# Patient Record
Sex: Male | Born: 1990 | Race: White | Hispanic: No | Marital: Single | Smoking: Current every day smoker
Health system: Southern US, Community
[De-identification: ages and names within clinical notes are randomized; demographics above are authoritative.]

## PROBLEM LIST (undated history)

## (undated) ENCOUNTER — Emergency Department: Discharge status: Absent without leave | Payer: No Typology Code available for payment source

## (undated) DIAGNOSIS — R454 Irritability and anger: Secondary | ICD-10-CM

## (undated) HISTORY — PX: TONSILLECTOMY: SUR1361

---
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
## 2010-12-21 ENCOUNTER — Emergency Department: Payer: Self-pay | Admitting: Unknown Physician Specialty

## 2011-01-27 ENCOUNTER — Emergency Department: Payer: Self-pay | Admitting: Emergency Medicine

## 2012-12-11 ENCOUNTER — Emergency Department: Payer: Self-pay | Admitting: Emergency Medicine

## 2014-03-04 ENCOUNTER — Emergency Department: Payer: Self-pay | Admitting: Emergency Medicine

## 2015-11-29 ENCOUNTER — Emergency Department: Payer: No Typology Code available for payment source

## 2015-11-29 ENCOUNTER — Emergency Department
Admission: EM | Admit: 2015-11-29 | Discharge: 2015-11-29 | Disposition: A | Discharge status: Home / Self Care | Payer: No Typology Code available for payment source | Attending: Emergency Medicine | Admitting: Emergency Medicine

## 2015-11-29 ENCOUNTER — Encounter: Payer: Self-pay | Admitting: Emergency Medicine

## 2015-11-29 DIAGNOSIS — S39012A Strain of muscle, fascia and tendon of lower back, initial encounter: Secondary | ICD-10-CM

## 2015-11-29 DIAGNOSIS — S29012A Strain of muscle and tendon of back wall of thorax, initial encounter: Secondary | ICD-10-CM | POA: Insufficient documentation

## 2015-11-29 DIAGNOSIS — F17200 Nicotine dependence, unspecified, uncomplicated: Secondary | ICD-10-CM | POA: Insufficient documentation

## 2015-11-29 DIAGNOSIS — S161XXA Strain of muscle, fascia and tendon at neck level, initial encounter: Secondary | ICD-10-CM | POA: Insufficient documentation

## 2015-11-29 DIAGNOSIS — Y9389 Activity, other specified: Secondary | ICD-10-CM | POA: Insufficient documentation

## 2015-11-29 DIAGNOSIS — H66003 Acute suppurative otitis media without spontaneous rupture of ear drum, bilateral: Secondary | ICD-10-CM | POA: Insufficient documentation

## 2015-11-29 DIAGNOSIS — S138XXA Sprain of joints and ligaments of other parts of neck, initial encounter: Secondary | ICD-10-CM | POA: Insufficient documentation

## 2015-11-29 DIAGNOSIS — Y92410 Unspecified street and highway as the place of occurrence of the external cause: Secondary | ICD-10-CM | POA: Insufficient documentation

## 2015-11-29 DIAGNOSIS — S63501A Unspecified sprain of right wrist, initial encounter: Secondary | ICD-10-CM

## 2015-11-29 DIAGNOSIS — S139XXA Sprain of joints and ligaments of unspecified parts of neck, initial encounter: Secondary | ICD-10-CM

## 2015-11-29 DIAGNOSIS — Y998 Other external cause status: Secondary | ICD-10-CM | POA: Insufficient documentation

## 2015-11-29 MED ORDER — AMOXICILLIN 500 MG PO TABS: 500.0000 mg | ORAL_TABLET | Freq: Three times a day (TID) | ORAL | Status: DC

## 2015-11-29 MED ORDER — OXYCODONE-ACETAMINOPHEN 5-325 MG PO TABS
1.0000 | ORAL_TABLET | Freq: Once | ORAL | Status: AC
  Administered 2015-11-29: 1 via ORAL
  Filled 2015-11-29: qty 1

## 2015-11-29 MED ORDER — OXYCODONE-ACETAMINOPHEN 5-325 MG PO TABS: 1.0000 | ORAL_TABLET | Freq: Four times a day (QID) | ORAL | Status: DC | PRN

## 2015-11-29 MED ORDER — IBUPROFEN 800 MG PO TABS: 800.0000 mg | ORAL_TABLET | Freq: Three times a day (TID) | ORAL | Status: DC | PRN

## 2015-11-29 MED ORDER — IBUPROFEN 800 MG PO TABS
800.0000 mg | ORAL_TABLET | Freq: Once | ORAL | Status: AC
  Administered 2015-11-29: 800 mg via ORAL
  Filled 2015-11-29: qty 1

## 2015-11-29 MED ORDER — CYCLOBENZAPRINE HCL 10 MG PO TABS: 10.0000 mg | ORAL_TABLET | Freq: Three times a day (TID) | ORAL | Status: DC | PRN
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
## 2015-11-29 NOTE — Discharge Instructions (Signed)
Low Back Strain With Rehab A strain is an injury in which a tendon or muscle is torn. The muscles and tendons of the lower back are vulnerable to strains. However, these muscles and tendons are very strong and require a great force to be injured. Strains are classified into three categories. Grade 1 strains cause pain, but the tendon is not lengthened. Grade 2 strains include a lengthened ligament, due to the ligament being stretched or partially ruptured. With grade 2 strains there is still function, although the function may be decreased. Grade 3 strains involve a complete tear of the tendon or muscle, and function is usually impaired. SYMPTOMS   Pain in the lower back.  Pain that affects one side more than the other.  Pain that gets worse with movement and may be felt in the hip, buttocks, or back of the thigh.  Muscle spasms of the muscles in the back.  Swelling along the muscles of the back.  Loss of strength of the back muscles.  Crackling sound (crepitation) when the muscles are touched. CAUSES  Lower back strains occur when a force is placed on the muscles or tendons that is greater than they can handle. Common causes of injury include:  Prolonged overuse of the muscle-tendon units in the lower back, usually from incorrect posture.  A single violent injury or force applied to the back. RISK INCREASES WITH:  Sports that involve twisting forces on the spine or a lot of bending at the waist (football, rugby, weightlifting, bowling, golf, tennis, speed skating, racquetball, swimming, running, gymnastics, diving).  Poor strength and flexibility.  Failure to warm up properly before activity.  Family history of lower back pain or disk disorders.  Previous back injury or surgery (especially fusion).  Poor posture with lifting, especially heavy objects.  Prolonged sitting, especially with poor posture. PREVENTION   Learn and use proper posture when sitting or lifting (maintain  proper posture when sitting, lift using the knees and legs, not at the waist).  Warm up and stretch properly before activity.  Allow for adequate recovery between workouts.  Maintain physical fitness:  Strength, flexibility, and endurance.  Cardiovascular fitness. PROGNOSIS  If treated properly, lower back strains usually heal within 6 weeks. RELATED COMPLICATIONS   Recurring symptoms, resulting in a chronic problem.  Chronic inflammation, scarring, and partial muscle-tendon tear.  Delayed healing or resolution of symptoms.  Prolonged disability. TREATMENT  Treatment first involves the use of ice and medicine, to reduce pain and inflammation. The use of strengthening and stretching exercises may help reduce pain with activity. These exercises may be performed at home or with a therapist. Severe injuries may require referral to a therapist for further evaluation and treatment, such as ultrasound. Your caregiver may advise that you wear a back brace or corset, to help reduce pain and discomfort. Often, prolonged bed rest results in greater harm then benefit. Corticosteroid injections may be recommended. However, these should be reserved for the most serious cases. It is important to avoid using your back when lifting objects. At night, sleep on your back on a firm mattress with a pillow placed under your knees. If non-surgical treatment is unsuccessful, surgery may be needed.  MEDICATION   If pain medicine is needed, nonsteroidal anti-inflammatory medicines (aspirin and ibuprofen), or other minor pain relievers (acetaminophen), are often advised.  Do not take pain medicine for 7 days before surgery.  Prescription pain relievers may be given, if your caregiver thinks they are needed. Use only as  directed and only as much as you need.  Ointments applied to the skin may be helpful.  Corticosteroid injections may be given by your caregiver. These injections should be reserved for the most  serious cases, because they may only be given a certain number of times. HEAT AND COLD  Cold treatment (icing) should be applied for 10 to 15 minutes every 2 to 3 hours for inflammation and pain, and immediately after activity that aggravates your symptoms. Use ice packs or an ice massage.  Heat treatment may be used before performing stretching and strengthening activities prescribed by your caregiver, physical therapist, or athletic trainer. Use a heat pack or a warm water soak. SEEK MEDICAL CARE IF:   Symptoms get worse or do not improve in 2 to 4 weeks, despite treatment.  You develop numbness, weakness, or loss of bowel or bladder function.  New, unexplained symptoms develop. (Drugs used in treatment may produce side effects.) EXERCISES  RANGE OF MOTION (ROM) AND STRETCHING EXERCISES - Low Back Strain Most people with lower back pain will find that their symptoms get worse with excessive bending forward (flexion) or arching at the lower back (extension). The exercises which will help resolve your symptoms will focus on the opposite motion.  Your physician, physical therapist or athletic trainer will help you determine which exercises will be most helpful to resolve your lower back pain. Do not complete any exercises without first consulting with your caregiver. Discontinue any exercises which make your symptoms worse until you speak to your caregiver.  If you have pain, numbness or tingling which travels down into your buttocks, leg or foot, the goal of the therapy is for these symptoms to move closer to your back and eventually resolve. Sometimes, these leg symptoms will get better, but your lower back pain may worsen. This is typically an indication of progress in your rehabilitation. Be very alert to any changes in your symptoms and the activities in which you participated in the 24 hours prior to the change. Sharing this information with your caregiver will allow him/her to most efficiently  treat your condition.  These exercises may help you when beginning to rehabilitate your injury. Your symptoms may resolve with or without further involvement from your physician, physical therapist or athletic trainer. While completing these exercises, remember:  Restoring tissue flexibility helps normal motion to return to the joints. This allows healthier, less painful movement and activity.  An effective stretch should be held for at least 30 seconds.  A stretch should never be painful. You should only feel a gentle lengthening or release in the stretched tissue. FLEXION RANGE OF MOTION AND STRETCHING EXERCISES: STRETCH - Flexion, Single Knee to Chest   Lie on a firm bed or floor with both legs extended in front of you.  Keeping one leg in contact with the floor, bring your opposite knee to your chest. Hold your leg in place by either grabbing behind your thigh or at your knee.  Pull until you feel a gentle stretch in your lower back. Hold __________ seconds.  Slowly release your grasp and repeat the exercise with the opposite side. Repeat __________ times. Complete this exercise __________ times per day.  STRETCH - Flexion, Double Knee to Chest   Lie on a firm bed or floor with both legs extended in front of you.  Keeping one leg in contact with the floor, bring your opposite knee to your chest.  Tense your stomach muscles to support your back and then   lift your other knee to your chest. Hold your legs in place by either grabbing behind your thighs or at your knees.  Pull both knees toward your chest until you feel a gentle stretch in your lower back. Hold __________ seconds.  Tense your stomach muscles and slowly return one leg at a time to the floor. Repeat __________ times. Complete this exercise __________ times per day.  STRETCH - Low Trunk Rotation  Lie on a firm bed or floor. Keeping your legs in front of you, bend your knees so they are both pointed toward the ceiling  and your feet are flat on the floor.  Extend your arms out to the side. This will stabilize your upper body by keeping your shoulders in contact with the floor.  Gently and slowly drop both knees together to one side until you feel a gentle stretch in your lower back. Hold for __________ seconds.  Tense your stomach muscles to support your lower back as you bring your knees back to the starting position. Repeat the exercise to the other side. Repeat __________ times. Complete this exercise __________ times per day  EXTENSION RANGE OF MOTION AND FLEXIBILITY EXERCISES: STRETCH - Extension, Prone on Elbows   Lie on your stomach on the floor, a bed will be too soft. Place your palms about shoulder width apart and at the height of your head.  Place your elbows under your shoulders. If this is too painful, stack pillows under your chest.  Allow your body to relax so that your hips drop lower and make contact more completely with the floor.  Hold this position for __________ seconds.  Slowly return to lying flat on the floor. Repeat __________ times. Complete this exercise __________ times per day.  RANGE OF MOTION - Extension, Prone Press Ups  Lie on your stomach on the floor, a bed will be too soft. Place your palms about shoulder width apart and at the height of your head.  Keeping your back as relaxed as possible, slowly straighten your elbows while keeping your hips on the floor. You may adjust the placement of your hands to maximize your comfort. As you gain motion, your hands will come more underneath your shoulders.  Hold this position __________ seconds.  Slowly return to lying flat on the floor. Repeat __________ times. Complete this exercise __________ times per day.  RANGE OF MOTION- Quadruped, Neutral Spine   Assume a hands and knees position on a firm surface. Keep your hands under your shoulders and your knees under your hips. You may place padding under your knees for  comfort.  Drop your head and point your tail bone toward the ground below you. This will round out your lower back like an angry cat. Hold this position for __________ seconds.  Slowly lift your head and release your tail bone so that your back sags into a large arch, like an old horse.  Hold this position for __________ seconds.  Repeat this until you feel limber in your lower back.  Now, find your "sweet spot." This will be the most comfortable position somewhere between the two previous positions. This is your neutral spine. Once you have found this position, tense your stomach muscles to support your lower back.  Hold this position for __________ seconds. Repeat __________ times. Complete this exercise __________ times per day.  STRENGTHENING EXERCISES - Low Back Strain These exercises may help you when beginning to rehabilitate your injury. These exercises should be done near your "sweet   spot." This is the neutral, low-back arch, somewhere between fully rounded and fully arched, that is your least painful position. When performed in this safe range of motion, these exercises can be used for people who have either a flexion or extension based injury. These exercises may resolve your symptoms with or without further involvement from your physician, physical therapist or athletic trainer. While completing these exercises, remember:  °· Muscles can gain both the endurance and the strength needed for everyday activities through controlled exercises. °· Complete these exercises as instructed by your physician, physical therapist or athletic trainer. Increase the resistance and repetitions only as guided. °· You may experience muscle soreness or fatigue, but the pain or discomfort you are trying to eliminate should never worsen during these exercises. If this pain does worsen, stop and make certain you are following the directions exactly. If the pain is still present after adjustments, discontinue the  exercise until you can discuss the trouble with your caregiver. °STRENGTHENING - Deep Abdominals, Pelvic Tilt °· Lie on a firm bed or floor. Keeping your legs in front of you, bend your knees so they are both pointed toward the ceiling and your feet are flat on the floor. °· Tense your lower abdominal muscles to press your lower back into the floor. This motion will rotate your pelvis so that your tail bone is scooping upwards rather than pointing at your feet or into the floor. °· With a gentle tension and even breathing, hold this position for __________ seconds. °Repeat __________ times. Complete this exercise __________ times per day.  °STRENGTHENING - Abdominals, Crunches  °· Lie on a firm bed or floor. Keeping your legs in front of you, bend your knees so they are both pointed toward the ceiling and your feet are flat on the floor. Cross your arms over your chest. °· Slightly tip your chin down without bending your neck. °· Tense your abdominals and slowly lift your trunk high enough to just clear your shoulder blades. Lifting higher can put excessive stress on the lower back and does not further strengthen your abdominal muscles. °· Control your return to the starting position. °Repeat __________ times. Complete this exercise __________ times per day.  °STRENGTHENING - Quadruped, Opposite UE/LE Lift  °· Assume a hands and knees position on a firm surface. Keep your hands under your shoulders and your knees under your hips. You may place padding under your knees for comfort. °· Find your neutral spine and gently tense your abdominal muscles so that you can maintain this position. Your shoulders and hips should form a rectangle that is parallel with the floor and is not twisted. °· Keeping your trunk steady, lift your right hand no higher than your shoulder and then your left leg no higher than your hip. Make sure you are not holding your breath. Hold this position __________ seconds. °· Continuing to keep your  abdominal muscles tense and your back steady, slowly return to your starting position. Repeat with the opposite arm and leg. °Repeat __________ times. Complete this exercise __________ times per day.  °STRENGTHENING - Lower Abdominals, Double Knee Lift °· Lie on a firm bed or floor. Keeping your legs in front of you, bend your knees so they are both pointed toward the ceiling and your feet are flat on the floor. °· Tense your abdominal muscles to brace your lower back and slowly lift both of your knees until they come over your hips. Be certain not to hold your breath. °·   Hold __________ seconds. Using your abdominal muscles, return to the starting position in a slow and controlled manner. Repeat __________ times. Complete this exercise __________ times per day.  POSTURE AND BODY MECHANICS CONSIDERATIONS - Low Back Strain Keeping correct posture when sitting, standing or completing your activities will reduce the stress put on different body tissues, allowing injured tissues a chance to heal and limiting painful experiences. The following are general guidelines for improved posture. Your physician or physical therapist will provide you with any instructions specific to your needs. While reading these guidelines, remember:  The exercises prescribed by your provider will help you have the flexibility and strength to maintain correct postures.  The correct posture provides the best environment for your joints to work. All of your joints have less wear and tear when properly supported by a spine with good posture. This means you will experience a healthier, less painful body.  Correct posture must be practiced with all of your activities, especially prolonged sitting and standing. Correct posture is as important when doing repetitive low-stress activities (typing) as it is when doing a single heavy-load activity (lifting). RESTING POSITIONS Consider which positions are most painful for you when choosing a  resting position. If you have pain with flexion-based activities (sitting, bending, stooping, squatting), choose a position that allows you to rest in a less flexed posture. You would want to avoid curling into a fetal position on your side. If your pain worsens with extension-based activities (prolonged standing, working overhead), avoid resting in an extended position such as sleeping on your stomach. Most people will find more comfort when they rest with their spine in a more neutral position, neither too rounded nor too arched. Lying on a non-sagging bed on your side with a pillow between your knees, or on your back with a pillow under your knees will often provide some relief. Keep in mind, being in any one position for a prolonged period of time, no matter how correct your posture, can still lead to stiffness. PROPER SITTING POSTURE In order to minimize stress and discomfort on your spine, you must sit with correct posture. Sitting with good posture should be effortless for a healthy body. Returning to good posture is a gradual process. Many people can work toward this most comfortably by using various supports until they have the flexibility and strength to maintain this posture on their own. When sitting with proper posture, your ears will fall over your shoulders and your shoulders will fall over your hips. You should use the back of the chair to support your upper back. Your lower back will be in a neutral position, just slightly arched. You may place a small pillow or folded towel at the base of your lower back for support.  When working at a desk, create an environment that supports good, upright posture. Without extra support, muscles tire, which leads to excessive strain on joints and other tissues. Keep these recommendations in mind: CHAIR:  A chair should be able to slide under your desk when your back makes contact with the back of the chair. This allows you to work closely.  The chair's  height should allow your eyes to be level with the upper part of your monitor and your hands to be slightly lower than your elbows. BODY POSITION  Your feet should make contact with the floor. If this is not possible, use a foot rest.  Keep your ears over your shoulders. This will reduce stress on your neck and  lower back. INCORRECT SITTING POSTURES  If you are feeling tired and unable to assume a healthy sitting posture, do not slouch or slump. This puts excessive strain on your back tissues, causing more damage and pain. Healthier options include:  Using more support, like a lumbar pillow.  Switching tasks to something that requires you to be upright or walking.  Talking a brief walk.  Lying down to rest in a neutral-spine position. PROLONGED STANDING WHILE SLIGHTLY LEANING FORWARD  When completing a task that requires you to lean forward while standing in one place for a long time, place either foot up on a stationary 2-4 inch high object to help maintain the best posture. When both feet are on the ground, the lower back tends to lose its slight inward curve. If this curve flattens (or becomes too large), then the back and your other joints will experience too much stress, tire more quickly, and can cause pain. CORRECT STANDING POSTURES Proper standing posture should be assumed with all daily activities, even if they only take a few moments, like when brushing your teeth. As in sitting, your ears should fall over your shoulders and your shoulders should fall over your hips. You should keep a slight tension in your abdominal muscles to brace your spine. Your tailbone should point down to the ground, not behind your body, resulting in an over-extended swayback posture.  INCORRECT STANDING POSTURES  Common incorrect standing postures include a forward head, locked knees and/or an excessive swayback. WALKING Walk with an upright posture. Your ears, shoulders and hips should all  line-up. PROLONGED ACTIVITY IN A FLEXED POSITION When completing a task that requires you to bend forward at your waist or lean over a low surface, try to find a way to stabilize 3 out of 4 of your limbs. You can place a hand or elbow on your thigh or rest a knee on the surface you are reaching across. This will provide you more stability so that your muscles do not fatigue as quickly. By keeping your knees relaxed, or slightly bent, you will also reduce stress across your lower back. CORRECT LIFTING TECHNIQUES DO :   Assume a wide stance. This will provide you more stability and the opportunity to get as close as possible to the object which you are lifting.  Tense your abdominals to brace your spine. Bend at the knees and hips. Keeping your back locked in a neutral-spine position, lift using your leg muscles. Lift with your legs, keeping your back straight.  Test the weight of unknown objects before attempting to lift them.  Try to keep your elbows locked down at your sides in order get the best strength from your shoulders when carrying an object.  Always ask for help when lifting heavy or awkward objects. INCORRECT LIFTING TECHNIQUES DO NOT:   Lock your knees when lifting, even if it is a small object.  Bend and twist. Pivot at your feet or move your feet when needing to change directions.  Assume that you can safely pick up even a paper clip without proper posture.   This information is not intended to replace advice given to you by your health care provider. Make sure you discuss any questions you have with your health care provider.   Document Released: 09/15/2005 Document Revised: 10/06/2014 Document Reviewed: 12/28/2008 [employer] Interactive Patient Education 2016 [employer]  Generic Wrist Exercises RANGE OF MOTION (ROM) AND STRETCHING EXERCISES - Wrist Sprain  These exercises may help you when  beginning to rehabilitate your injury. Your symptoms may resolve with or without  further involvement from your physician, physical therapist, or athletic trainer. While completing these exercises, remember:   Restoring tissue flexibility helps normal motion to return to the joints. This allows healthier, less painful movement and activity.  An effective stretch should be held for at least 30 seconds.  A stretch should never be painful. You should only feel a gentle lengthening or release in the stretched tissue. RANGE OF MOTION - Wrist Flexion, Active-Assisted  Extend your right / left elbow with your palm pointing down.*  Gently pull the back of your hand toward you until you feel a gentle stretch on the top of your forearm.  Hold this position for __________ seconds. Repeat __________ times. Complete this exercise __________ times per day.  *If directed by your physician, physical therapist, or athletic trainer, complete this stretch with your elbow bent rather than extended. RANGE OF MOTION - Wrist Extension, Active-Assisted   Extend your right / left elbow and turn your palm upward.*  Gently pull your palm/fingertips back so your wrist extends and your fingers point more toward the ground.  You should feel a gentle stretch on the inside of your forearm.  Hold this position for __________ seconds. Repeat __________ times. Complete this exercise __________ times per day. *If directed by your physician, physical therapist, or athletic trainer, complete this stretch with your elbow bent, rather than extended. RANGE OF MOTION - Supination, Active   Stand or sit with your elbows at your side. Bend your right / left elbow to 90 degrees.  Turn your palm upward until you feel a gentle stretch on the inside of your forearm.  Hold this position for __________ seconds. Slowly release and return to the starting position. Repeat __________ times. Complete this stretch __________ times per day.  RANGE OF MOTION - Pronation, Active   Stand or sit with your elbows at your  side. Bend your right / left elbow to 90 degrees.  Turn your palm downward until you feel a gentle stretch on the top of your forearm.  Hold this position for __________ seconds. Slowly release and return to the starting position. Repeat __________ times. Complete this stretch __________ times per day.  STRENGTHENING EXERCISES  These exercises may help you when beginning to rehabilitate your injury. They may resolve your symptoms with or without further involvement from your physician, physical therapist, or athletic trainer. While completing these exercises, remember:   Muscles can gain both the endurance and the strength needed for everyday activities through controlled exercises.  Complete these exercises as instructed by your physician, physical therapist, or athletic trainer. Progress the resistance and repetitions only as guided.  You may experience muscle soreness or fatigue, but the pain or discomfort you are trying to eliminate should never worsen during these exercises. If this pain does worsen, stop and make certain you are following the directions exactly. If the pain is still present after adjustments, discontinue the exercise until you can discuss the trouble with your clinician. STRENGTH - Wrist Flexors  Sit with your right / left forearm palm-up and fully supported. Your elbow should be resting below the height of your shoulder. Allow your wrist to extend over the edge of the surface.  Loosely holding a __________ weight or a piece of rubber exercise band/tubing, slowly curl your hand up toward your forearm.  Hold this position for __________ seconds. Slowly lower the wrist back to the starting position in a  controlled manner. Repeat __________ times. Complete this exercise __________ times per day.  STRENGTH - Wrist Extensors  Sit with your right / left forearm palm-down and fully supported. Your elbow should be resting below the height of your shoulder. Allow your wrist to  extend over the edge of the surface.  Loosely holding a __________ weight or a piece of rubber exercise band/tubing, slowly curl your hand up toward your forearm.  Hold this position for __________ seconds. Slowly lower the wrist back to the starting position in a controlled manner. Repeat __________ times. Complete this exercise __________ times per day.  STRENGTH - Forearm Supinators  Sit with your right / left forearm supported on a table, keeping your elbow below shoulder height. Rest your hand over the edge, palm down.  Gently grip a hammer or a soup ladle.  Without moving your elbow, slowly turn your palm and hand upward to a "thumbs-up" position.  Hold this position for __________ seconds. Slowly return to the starting position. Repeat __________ times. Complete this exercise __________ times per day.  STRENGTH - Forearm Pronators   Sit with your right / left forearm supported on a table, keeping your elbow below shoulder height. Rest your hand over the edge, palm up.  Gently grip a hammer or a soup ladle.  Without moving your elbow, slowly turn your palm and hand upward to a "thumbs-up" position.  Hold this position for __________ seconds. Slowly return to the starting position. Repeat __________ times. Complete this exercise __________ times per day.  STRENGTH - Grip  Grasp a tennis ball, a dense sponge, or a large, rolled sock in your hand.  Squeeze as hard as you can without increasing any pain.  Hold this position for __________ seconds. Release your grip slowly. Repeat __________ times. Complete this exercise __________ times per day.    This information is not intended to replace advice given to you by your health care provider. Make sure you discuss any questions you have with your health care provider.   Document Released: 07/30/2005 Document Revised: 10/06/2014 Document Reviewed: 12/28/2008 Elsevier Interactive Patient Education 2016 [employer]  Wrist  Sprain With Rehab A sprain is an injury in which a ligament that maintains the proper alignment of a joint is partially or completely torn. The ligaments of the wrist are susceptible to sprains. Sprains are classified into three categories. Grade 1 sprains cause pain, but the tendon is not lengthened. Grade 2 sprains include a lengthened ligament because the ligament is stretched or partially ruptured. With grade 2 sprains there is still function, although the function may be diminished. Grade 3 sprains are characterized by a complete tear of the tendon or muscle, and function is usually impaired. SYMPTOMS   Pain tenderness, inflammation, and/or bruising (contusion) of the injury.  A "pop" or tear felt and/or heard at the time of injury.  Decreased wrist function. CAUSES  A wrist sprain occurs when a force is placed on one or more ligaments that is greater than it/they can withstand. Common mechanisms of injury include:  Catching a ball with your hands.  Repetitive and/ or strenuous extension or flexion of the wrist. RISK INCREASES WITH:  Previous wrist injury.  Contact sports (boxing or wrestling).  Activities in which falling is common.  Poor strength and flexibility.  Improperly fitted or padded protective equipment. PREVENTION  Warm up and stretch properly before activity.  Allow for adequate recovery between workouts.  Maintain physical fitness:  Strength, flexibility, and endurance.  Cardiovascular  fitness.  Protect the wrist joint by limiting its motion with the use of taping, braces, or splints.  Protect the wrist after injury for 6 to 12 months. PROGNOSIS  The prognosis for wrist sprains depends on the degree of injury. Grade 1 sprains require 2 to 6 weeks of treatment. Grade 2 sprains require 6 to 8 weeks of treatment, and grade 3 sprains require up to 12 weeks.  RELATED COMPLICATIONS   Prolonged healing time, if improperly treated or re-injured.  Recurrent  symptoms that result in a chronic problem.  Injury to nearby structures (bone, cartilage, nerves, or tendons).  Arthritis of the wrist.  Inability to compete in athletics at a high level.  Wrist stiffness or weakness.  Progression to a complete rupture of the ligament. TREATMENT  Treatment initially involves resting from any activities that aggravate the symptoms, and the use of ice and medications to help reduce pain and inflammation. Your caregiver may recommend immobilizing the wrist for a period of time in order to reduce stress on the ligament and allow for healing. After immobilization it is important to perform strengthening and stretching exercises to help regain strength and a full range of motion. These exercises may be completed at home or with a therapist. Surgery is not usually required for wrist sprains, unless the ligament has been ruptured (grade 3 sprain). MEDICATION   If pain medication is necessary, then nonsteroidal anti-inflammatory medications, such as aspirin and ibuprofen, or other minor pain relievers, such as acetaminophen, are often recommended.  Do not take pain medication for 7 days before surgery.  Prescription pain relievers may be given if deemed necessary by your caregiver. Use only as directed and only as much as you need. HEAT AND COLD  Cold treatment (icing) relieves pain and reduces inflammation. Cold treatment should be applied for 10 to 15 minutes every 2 to 3 hours for inflammation and pain and immediately after any activity that aggravates your symptoms. Use ice packs or massage the area with a piece of ice (ice massage).  Heat treatment may be used prior to performing the stretching and strengthening activities prescribed by your caregiver, physical therapist, or athletic trainer. Use a heat pack or soak your injury in warm water. SEEK MEDICAL CARE IF:  Treatment seems to offer no benefit, or the condition worsens.  Any medications produce  adverse side effects. EXERCISES RANGE OF MOTION (ROM) AND STRETCHING EXERCISES - Wrist Sprain  These exercises may help you when beginning to rehabilitate your injury. Your symptoms may resolve with or without further involvement from your physician, physical therapist or athletic trainer. While completing these exercises, remember:   Restoring tissue flexibility helps normal motion to return to the joints. This allows healthier, less painful movement and activity.  An effective stretch should be held for at least 30 seconds.  A stretch should never be painful. You should only feel a gentle lengthening or release in the stretched tissue. RANGE OF MOTION - Wrist Flexion, Active-Assisted  Extend your right / left elbow with your fingers pointing down.*  Gently pull the back of your hand towards you until you feel a gentle stretch on the top of your forearm.  Hold this position for __________ seconds. Repeat __________ times. Complete this exercise __________ times per day.  *If directed by your physician, physical therapist or athletic trainer, complete this stretch with your elbow bent rather than extended. RANGE OF MOTION - Wrist Extension, Active-Assisted  Extend your right / left elbow and turn your  palm upwards.*  Gently pull your palm/fingertips back so your wrist extends and your fingers point more toward the ground.  You should feel a gentle stretch on the inside of your forearm.  Hold this position for __________ seconds. Repeat __________ times. Complete this exercise __________ times per day. *If directed by your physician, physical therapist or athletic trainer, complete this stretch with your elbow bent, rather than extended. RANGE OF MOTION - Supination, Active  Stand or sit with your elbows at your side. Bend your right / left elbow to 90 degrees.  Turn your palm upward until you feel a gentle stretch on the inside of your forearm.  Hold this position for __________  seconds. Slowly release and return to the starting position. Repeat __________ times. Complete this stretch __________ times per day.  RANGE OF MOTION - Pronation, Active  Stand or sit with your elbows at your side. Bend your right / left elbow to 90 degrees.  Turn your palm downward until you feel a gentle stretch on the top of your forearm.  Hold this position for __________ seconds. Slowly release and return to the starting position. Repeat __________ times. Complete this stretch __________ times per day.  STRETCH - Wrist Flexion  Place the back of your right / left hand on a tabletop leaving your elbow slightly bent. Your fingers should point away from your body.  Gently press the back of your hand down onto the table by straightening your elbow. You should feel a stretch on the top of your forearm.  Hold this position for __________ seconds. Repeat __________ times. Complete this stretch __________ times per day.  STRETCH - Wrist Extension  Place your right / left fingertips on a tabletop leaving your elbow slightly bent. Your fingers should point backwards.  Gently press your fingers and palm down onto the table by straightening your elbow. You should feel a stretch on the inside of your forearm.  Hold this position for __________ seconds. Repeat __________ times. Complete this stretch __________ times per day.  STRENGTHENING EXERCISES - Wrist Sprain These exercises may help you when beginning to rehabilitate your injury. They may resolve your symptoms with or without further involvement from your physician, physical therapist or athletic trainer. While completing these exercises, remember:   Muscles can gain both the endurance and the strength needed for everyday activities through controlled exercises.  Complete these exercises as instructed by your physician, physical therapist or athletic trainer. Progress with the resistance and repetition exercises only as your caregiver  advises. STRENGTH - Wrist Flexors  Sit with your right / left forearm palm-up and fully supported. Your elbow should be resting below the height of your shoulder. Allow your wrist to extend over the edge of the surface.  Loosely holding a __________ weight or a piece of rubber exercise band/tubing, slowly curl your hand up toward your forearm.  Hold this position for __________ seconds. Slowly lower the wrist back to the starting position in a controlled manner. Repeat __________ times. Complete this exercise __________ times per day.  STRENGTH - Wrist Extensors  Sit with your right / left forearm palm-down and fully supported. Your elbow should be resting below the height of your shoulder. Allow your wrist to extend over the edge of the surface.  Loosely holding a __________ weight or a piece of rubber exercise band/tubing, slowly curl your hand up toward your forearm.  Hold this position for __________ seconds. Slowly lower the wrist back to the starting position in  a controlled manner. Repeat __________ times. Complete this exercise __________ times per day.  STRENGTH - Ulnar Deviators  Stand with a ____________________ weight in your right / left hand, or sit holding on to the rubber exercise band/tubing with your opposite arm supported.  Move your wrist so that your pinkie travels toward your forearm and your thumb moves away from your forearm.  Hold this position for __________ seconds and then slowly lower the wrist back to the starting position. Repeat __________ times. Complete this exercise __________ times per day STRENGTH - Radial Deviators  Stand with a ____________________ weight in your  right / left hand, or sit holding on to the rubber exercise band/tubing with your arm supported.  Raise your hand upward in front of you or pull up on the rubber tubing.  Hold this position for __________ seconds and then slowly lower the wrist back to the starting position. Repeat  __________ times. Complete this exercise __________ times per day. STRENGTH - Forearm Supinators  Sit with your right / left forearm supported on a table, keeping your elbow below shoulder height. Rest your hand over the edge, palm down.  Gently grip a hammer or a soup ladle.  Without moving your elbow, slowly turn your palm and hand upward to a "thumbs-up" position.  Hold this position for __________ seconds. Slowly return to the starting position. Repeat __________ times. Complete this exercise __________ times per day.  STRENGTH - Forearm Pronators  Sit with your right / left forearm supported on a table, keeping your elbow below shoulder height. Rest your hand over the edge, palm up.  Gently grip a hammer or a soup ladle.  Without moving your elbow, slowly turn your palm and hand upward to a "thumbs-up" position.  Hold this position for __________ seconds. Slowly return to the starting position. Repeat __________ times. Complete this exercise __________ times per day.  STRENGTH - Grip  Grasp a tennis ball, a dense sponge, or a large, rolled sock in your hand.  Squeeze as hard as you can without increasing any pain.  Hold this position for __________ seconds. Release your grip slowly. Repeat __________ times. Complete this exercise __________ times per day.    This information is not intended to replace advice given to you by your health care provider. Make sure you discuss any questions you have with your health care provider.   Document Released: 09/15/2005 Document Revised: 06/06/2015 Document Reviewed: 12/28/2008 Elsevier Interactive Patient Education 2016 [employer]   Take pain medicine as directed. Follow-up with the orthopedics if not improving. Return to the emergency room for any concerns.
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
## 2016-01-31 LAB — HM HIV SCREENING LAB: HM HIV Screening: NEGATIVE

## 2016-12-28 ENCOUNTER — Encounter: Payer: Self-pay | Admitting: Emergency Medicine

## 2016-12-28 ENCOUNTER — Emergency Department
Admission: EM | Admit: 2016-12-28 | Discharge: 2016-12-28 | Disposition: A | Discharge status: Home / Self Care | Payer: Self-pay | Attending: Emergency Medicine | Admitting: Emergency Medicine

## 2016-12-28 DIAGNOSIS — F10120 Alcohol abuse with intoxication, uncomplicated: Secondary | ICD-10-CM | POA: Insufficient documentation

## 2016-12-28 DIAGNOSIS — Y999 Unspecified external cause status: Secondary | ICD-10-CM | POA: Insufficient documentation

## 2016-12-28 DIAGNOSIS — F17200 Nicotine dependence, unspecified, uncomplicated: Secondary | ICD-10-CM | POA: Insufficient documentation

## 2016-12-28 DIAGNOSIS — S50812A Abrasion of left forearm, initial encounter: Secondary | ICD-10-CM | POA: Insufficient documentation

## 2016-12-28 DIAGNOSIS — Y939 Activity, unspecified: Secondary | ICD-10-CM | POA: Insufficient documentation

## 2016-12-28 DIAGNOSIS — X789XXA Intentional self-harm by unspecified sharp object, initial encounter: Secondary | ICD-10-CM | POA: Insufficient documentation

## 2016-12-28 DIAGNOSIS — Y929 Unspecified place or not applicable: Secondary | ICD-10-CM | POA: Insufficient documentation

## 2016-12-28 DIAGNOSIS — T148XXA Other injury of unspecified body region, initial encounter: Secondary | ICD-10-CM

## 2016-12-28 DIAGNOSIS — F10920 Alcohol use, unspecified with intoxication, uncomplicated: Secondary | ICD-10-CM

## 2016-12-28 LAB — COMPREHENSIVE METABOLIC PANEL
ALT: 19 U/L (ref 17–63)
ANION GAP: 9 (ref 5–15)
AST: 27 U/L (ref 15–41)
Albumin: 4.6 g/dL (ref 3.5–5.0)
Alkaline Phosphatase: 52 U/L (ref 38–126)
BUN: 13 mg/dL (ref 6–20)
CO2: 23 mmol/L (ref 22–32)
Calcium: 8.9 mg/dL (ref 8.9–10.3)
Chloride: 109 mmol/L (ref 101–111)
Creatinine, Ser: 0.96 mg/dL (ref 0.61–1.24)
Glucose, Bld: 109 mg/dL — ABNORMAL HIGH (ref 65–99)
POTASSIUM: 3.3 mmol/L — AB (ref 3.5–5.1)
Sodium: 141 mmol/L (ref 135–145)
Total Bilirubin: 0.6 mg/dL (ref 0.3–1.2)
Total Protein: 7.6 g/dL (ref 6.5–8.1)

## 2016-12-28 LAB — CBC
HEMATOCRIT: 45.6 % (ref 40.0–52.0)
HEMOGLOBIN: 15.7 g/dL (ref 13.0–18.0)
MCH: 31.1 pg (ref 26.0–34.0)
MCHC: 34.5 g/dL (ref 32.0–36.0)
MCV: 90.3 fL (ref 80.0–100.0)
Platelets: 237 10*3/uL (ref 150–440)
RBC: 5.05 MIL/uL (ref 4.40–5.90)
RDW: 14.1 % (ref 11.5–14.5)
WBC: 9.6 10*3/uL (ref 3.8–10.6)

## 2016-12-28 LAB — ETHANOL: ALCOHOL ETHYL (B): 167 mg/dL — AB (ref <5)

## 2016-12-28 NOTE — ED Notes (Signed)
Pt ambulated to side door and back to room with no problems and no complaints. SOC set up in pt room and explained to pt what would take place. Pt stated he understood.

## 2016-12-28 NOTE — Discharge Instructions (Signed)
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
## 2016-12-28 NOTE — ED Notes (Signed)
ED Provider at bedside. 

## 2016-12-28 NOTE — ED Notes (Signed)
Pt given phone to call ride 

## 2016-12-28 NOTE — ED Triage Notes (Signed)
FIRST NURSE NOTE:  Arrives with [place] [employer] to be evaluated voluntarily.  Patient states he was cutting himself this morning because he was anxious.  Denies SI/HI.  Superficial cuts noted to right posterior forearm.  Bleeding controlled.

## 2016-12-28 NOTE — ED Notes (Signed)
Pt called mother for ride.  Patient to go to lobby to wait for her.
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
## 2016-12-28 NOTE — ED Notes (Signed)
Tech, Pam and RN, Jeannett Senior performing 15 min checks on pt. Pt is sleeping at this time.

## 2017-02-27 ENCOUNTER — Emergency Department
Admission: EM | Admit: 2017-02-27 | Discharge: 2017-02-28 | Disposition: A | Discharge status: Home / Self Care | Payer: Self-pay | Attending: Emergency Medicine | Admitting: Emergency Medicine

## 2017-02-27 ENCOUNTER — Encounter: Payer: Self-pay | Admitting: Emergency Medicine

## 2017-02-27 DIAGNOSIS — L089 Local infection of the skin and subcutaneous tissue, unspecified: Secondary | ICD-10-CM

## 2017-02-27 DIAGNOSIS — Y999 Unspecified external cause status: Secondary | ICD-10-CM | POA: Insufficient documentation

## 2017-02-27 DIAGNOSIS — W57XXXA Bitten or stung by nonvenomous insect and other nonvenomous arthropods, initial encounter: Secondary | ICD-10-CM | POA: Insufficient documentation

## 2017-02-27 DIAGNOSIS — S80862A Insect bite (nonvenomous), left lower leg, initial encounter: Secondary | ICD-10-CM | POA: Insufficient documentation

## 2017-02-27 DIAGNOSIS — Y929 Unspecified place or not applicable: Secondary | ICD-10-CM | POA: Insufficient documentation

## 2017-02-27 DIAGNOSIS — F17200 Nicotine dependence, unspecified, uncomplicated: Secondary | ICD-10-CM | POA: Insufficient documentation

## 2017-02-27 DIAGNOSIS — Y939 Activity, unspecified: Secondary | ICD-10-CM | POA: Insufficient documentation

## 2017-02-27 LAB — COMPREHENSIVE METABOLIC PANEL
ALBUMIN: 4.3 g/dL (ref 3.5–5.0)
ALT: 25 U/L (ref 17–63)
AST: 30 U/L (ref 15–41)
Alkaline Phosphatase: 54 U/L (ref 38–126)
Anion gap: 10 (ref 5–15)
BUN: 19 mg/dL (ref 6–20)
CHLORIDE: 107 mmol/L (ref 101–111)
CO2: 25 mmol/L (ref 22–32)
CREATININE: 1.28 mg/dL — AB (ref 0.61–1.24)
Calcium: 9.3 mg/dL (ref 8.9–10.3)
GFR calc Af Amer: >60 mL/min (ref >60)
Glucose, Bld: 108 mg/dL — ABNORMAL HIGH (ref 65–99)
POTASSIUM: 3.5 mmol/L (ref 3.5–5.1)
Sodium: 142 mmol/L (ref 135–145)
Total Bilirubin: 0.6 mg/dL (ref 0.3–1.2)
Total Protein: 7 g/dL (ref 6.5–8.1)

## 2017-02-27 LAB — CBC WITH DIFFERENTIAL/PLATELET
Basophils Absolute: 0.1 10*3/uL (ref 0–0.1)
Basophils Relative: 1 %
EOS ABS: 0.1 10*3/uL (ref 0–0.7)
EOS PCT: 1 %
HCT: 46.9 % (ref 40.0–52.0)
Hemoglobin: 16.1 g/dL (ref 13.0–18.0)
LYMPHS ABS: 2.3 10*3/uL (ref 1.0–3.6)
LYMPHS PCT: 18 %
MCH: 30.9 pg (ref 26.0–34.0)
MCHC: 34.3 g/dL (ref 32.0–36.0)
MCV: 90.2 fL (ref 80.0–100.0)
MONO ABS: 1.1 10*3/uL — AB (ref 0.2–1.0)
MONOS PCT: 8 %
Neutro Abs: 9.5 10*3/uL — ABNORMAL HIGH (ref 1.4–6.5)
Neutrophils Relative %: 72 %
PLATELETS: 242 10*3/uL (ref 150–440)
RBC: 5.2 MIL/uL (ref 4.40–5.90)
RDW: 13.7 % (ref 11.5–14.5)
WBC: 13.1 10*3/uL — ABNORMAL HIGH (ref 3.8–10.6)

## 2017-02-27 NOTE — ED Notes (Signed)
Pt report spider bite two days ago to ,left lower leg, redness/swelling/warmth noted, area of redness measured 13cmx10cm.  Scabbed puncture seen in middle of reddened area.

## 2017-02-27 NOTE — ED Triage Notes (Signed)
Pt to triage via WC, reports spider bite to left lower leg 2 days ago, redness/swelling noted to medial aspect of left lower leg.  Reports warm to touch.  Pt reports he squeezed it and pus came out.  Pt reports unable to walk due to pain.

## 2017-02-28 MED ORDER — OXYCODONE-ACETAMINOPHEN 5-325 MG PO TABS: 1.0000 | ORAL_TABLET | Freq: Four times a day (QID) | ORAL | 0 refills | Status: DC | PRN

## 2017-02-28 MED ORDER — OXYCODONE-ACETAMINOPHEN 5-325 MG PO TABS
1.0000 | ORAL_TABLET | Freq: Once | ORAL | Status: AC
  Administered 2017-02-28: 1 via ORAL
  Filled 2017-02-28: qty 1

## 2017-02-28 MED ORDER — SULFAMETHOXAZOLE-TRIMETHOPRIM 800-160 MG PO TABS
1.0000 | ORAL_TABLET | Freq: Once | ORAL | Status: AC
  Administered 2017-02-28: 1 via ORAL
  Filled 2017-02-28: qty 1

## 2017-02-28 MED ORDER — SULFAMETHOXAZOLE-TRIMETHOPRIM 800-160 MG PO TABS: 1.0000 | ORAL_TABLET | Freq: Two times a day (BID) | ORAL | 0 refills | Status: AC

## 2017-02-28 NOTE — ED Provider Notes (Signed)
[facility] Emergency Department Provider Note    First MD Initiated Contact with Patient 02/28/17 0006     (approximate)  I have reviewed the triage vital signs and the nursing notes.   HISTORY  Chief Complaint Insect Bite and Leg Pain    HPI Spencer Schultz is a 26 y.o. male presents to the emergency department 1 day history of insect bite to the left lower extremity with surrounding redness and pus. Patient denies any fever or febrile on presentation. Patient states that he did not see what insect bit him.  Past medical history None There are no active problems to display for this patient.   Past surgical history None  Prior to Admission medications   Medication Sig Start Date End Date Taking? Authorizing Provider  amoxicillin (AMOXIL) 500 MG tablet Take 1 tablet (500 mg total) by mouth 3 (three) times daily. 11/29/15   Ignacia Bayley, PA-C  cyclobenzaprine (FLEXERIL) 10 MG tablet Take 1 tablet (10 mg total) by mouth every 8 (eight) hours as needed for muscle spasms. 11/29/15   Ignacia Bayley, PA-C  ibuprofen (ADVIL,MOTRIN) 800 MG tablet Take 1 tablet (800 mg total) by mouth every 8 (eight) hours as needed. 11/29/15   Ignacia Bayley, PA-C  oxyCODONE-acetaminophen (ROXICET) 5-325 MG tablet Take 1 tablet by mouth every 6 (six) hours as needed. 11/29/15   Ignacia Bayley, PA-C    Allergies Patient has no known allergies.  History reviewed. No pertinent family history.  Social History Social History  Substance Use Topics  . Smoking status: Current Every Day Smoker  . Smokeless tobacco: Never Used  . Alcohol use No    Review of Systems Constitutional: No fever/chills Eyes: No visual changes. ENT: No sore throat. Cardiovascular: Denies chest pain. Respiratory: Denies shortness of breath. Gastrointestinal: No abdominal pain.  No nausea, no vomiting.  No diarrhea.  No constipation. Genitourinary: Negative for dysuria. Musculoskeletal: Negative for neck  pain.  Negative for back pain. Integumentary: Negative for rash.Positive for insect bite to the left leg with redness Neurological: Negative for headaches, focal weakness or numbness.  ____________________________________________   PHYSICAL EXAM:  VITAL SIGNS: ED Triage Vitals  Enc Vitals Group     BP 02/27/17 2245 (!) 132/91     Pulse Rate 02/27/17 2245 94     Resp 02/27/17 2245 16     Temp 02/27/17 2245 98.8 F (37.1 C)     Temp Source 02/27/17 2245 Oral     SpO2 02/27/17 2245 99 %     Weight 02/27/17 2245 90.7 kg (200 lb)     Height 02/27/17 2245 1.778 m (5\' 10" )     Head Circumference --      Peak Flow --      Pain Score 02/27/17 2244 7     Pain Loc --      Pain Edu? --      Excl. in GC? --     Constitutional: Alert and oriented. Well appearing and in no acute distress. Eyes: Conjunctivae are normal. Head: Atraumatic. Mouth/Throat: Mucous membranes are moist. Oropharynx non-erythematous. Neck: No stridor.   Cardiovascular: Normal rate, regular rhythm. Good peripheral circulation. Grossly normal heart sounds. Respiratory: Normal respiratory effort.  No retractions. Lungs CTAB. Gastrointestinal: Soft and nontender. No distention.  Musculoskeletal: No lower extremity tenderness nor edema. No gross deformities of extremities. Neurologic:  Normal speech and language. No gross focal neurologic deficits are appreciated.  Skin:  1 mm punctate area of excoriation with surrounding blanching erythema  and is approximately 6 x 6 cm in the left leg. No evidence of necrosis. Psychiatric: Mood and affect are normal. Speech and behavior are normal.  ____________________________________________   LABS (all labs ordered are listed, but only abnormal results are displayed)  Labs Reviewed  COMPREHENSIVE METABOLIC PANEL - Abnormal; Notable for the following:       Result Value   Glucose, Bld 108 (*)    Creatinine, Ser 1.28 (*)    All other components within normal limits  CBC WITH  DIFFERENTIAL/PLATELET - Abnormal; Notable for the following:    WBC 13.1 (*)    Neutro Abs 9.5 (*)    Monocytes Absolute 1.1 (*)    All other components within normal limits   __________________  Procedures   ____________________________________________   INITIAL IMPRESSION / ASSESSMENT AND PLAN / ED COURSE  Pertinent labs & imaging results that were available during my care of the patient were reviewed by me and considered in my medical decision making (see chart for details).  Patient given Bactrim DS and Percocet emergency department will be prescribed same for home. Spoke with the patient at length regarding warning signs of worsening infection that would want return to the emergency department.      ____________________________________________  FINAL CLINICAL IMPRESSION(S) / ED DIAGNOSES  Final diagnoses:  Infected insect bite of left lower extremity, initial encounter     MEDICATIONS GIVEN DURING THIS VISIT:  Medications  sulfamethoxazole-trimethoprim (BACTRIM DS,SEPTRA DS) 800-160 MG per tablet 1 tablet (not administered)  oxyCODONE-acetaminophen (PERCOCET/ROXICET) 5-325 MG per tablet 1 tablet (not administered)     NEW OUTPATIENT MEDICATIONS STARTED DURING THIS VISIT:  New Prescriptions   No medications on file    Modified Medications   No medications on file    Discontinued Medications   No medications on file     Note:  This document was prepared using Dragon voice recognition software and may include unintentional dictation errors.    [name], [place], MD 02/28/17 [phone]

## 2017-03-01 ENCOUNTER — Emergency Department
Admission: EM | Admit: 2017-03-01 | Discharge: 2017-03-01 | Disposition: A | Discharge status: Home / Self Care | Payer: Self-pay | Attending: Emergency Medicine | Admitting: Emergency Medicine

## 2017-03-01 DIAGNOSIS — F17200 Nicotine dependence, unspecified, uncomplicated: Secondary | ICD-10-CM | POA: Insufficient documentation

## 2017-03-01 DIAGNOSIS — L03116 Cellulitis of left lower limb: Secondary | ICD-10-CM | POA: Insufficient documentation

## 2017-03-01 LAB — CBC WITH DIFFERENTIAL/PLATELET
BASOS PCT: 0 %
Basophils Absolute: 0 10*3/uL (ref 0–0.1)
Eosinophils Absolute: 0.1 10*3/uL (ref 0–0.7)
Eosinophils Relative: 1 %
HEMATOCRIT: 44.9 % (ref 40.0–52.0)
HEMOGLOBIN: 15.5 g/dL (ref 13.0–18.0)
LYMPHS PCT: 15 %
Lymphs Abs: 1.5 10*3/uL (ref 1.0–3.6)
MCH: 31.4 pg (ref 26.0–34.0)
MCHC: 34.6 g/dL (ref 32.0–36.0)
MCV: 90.9 fL (ref 80.0–100.0)
Monocytes Absolute: 0.9 10*3/uL (ref 0.2–1.0)
Monocytes Relative: 9 %
NEUTROS ABS: 7.3 10*3/uL — AB (ref 1.4–6.5)
NEUTROS PCT: 75 %
Platelets: 202 10*3/uL (ref 150–440)
RBC: 4.95 MIL/uL (ref 4.40–5.90)
RDW: 13.9 % (ref 11.5–14.5)
WBC: 9.9 10*3/uL (ref 3.8–10.6)

## 2017-03-01 LAB — COMPREHENSIVE METABOLIC PANEL
ALBUMIN: 3.9 g/dL (ref 3.5–5.0)
ALK PHOS: 52 U/L (ref 38–126)
ALT: 20 U/L (ref 17–63)
ANION GAP: 7 (ref 5–15)
AST: 19 U/L (ref 15–41)
BILIRUBIN TOTAL: 0.5 mg/dL (ref 0.3–1.2)
BUN: 16 mg/dL (ref 6–20)
CALCIUM: 8.8 mg/dL — AB (ref 8.9–10.3)
CO2: 24 mmol/L (ref 22–32)
Chloride: 108 mmol/L (ref 101–111)
Creatinine, Ser: 1.29 mg/dL — ABNORMAL HIGH (ref 0.61–1.24)
GFR calc Af Amer: >60 mL/min (ref >60)
GFR calc non Af Amer: >60 mL/min (ref >60)
GLUCOSE: 95 mg/dL (ref 65–99)
Potassium: 4 mmol/L (ref 3.5–5.1)
Sodium: 139 mmol/L (ref 135–145)
TOTAL PROTEIN: 7.2 g/dL (ref 6.5–8.1)

## 2017-03-01 MED ORDER — CEFTRIAXONE SODIUM IN DEXTROSE 20 MG/ML IV SOLN: 1.0000 g | INTRAVENOUS | Status: DC

## 2017-03-01 MED ORDER — MORPHINE SULFATE (PF) 4 MG/ML IV SOLN
4.0000 mg | Freq: Once | INTRAVENOUS | Status: AC
  Administered 2017-03-01: 4 mg via INTRAVENOUS
  Filled 2017-03-01: qty 1

## 2017-03-01 MED ORDER — DEXTROSE 5 % IV SOLN
1.0000 g | INTRAVENOUS | Status: DC
  Filled 2017-03-01: qty 10

## 2017-03-01 MED ORDER — DEXTROSE 5 % IV SOLN
1.0000 g | Freq: Once | INTRAVENOUS | Status: AC
  Administered 2017-03-01: 1 g via INTRAVENOUS
  Filled 2017-03-01: qty 10

## 2017-03-01 MED ORDER — CEPHALEXIN 500 MG PO CAPS: 500.0000 mg | ORAL_CAPSULE | Freq: Four times a day (QID) | ORAL | 0 refills | Status: DC

## 2017-03-01 MED ORDER — OXYCODONE-ACETAMINOPHEN 5-325 MG PO TABS: 1.0000 | ORAL_TABLET | ORAL | 0 refills | Status: DC | PRN

## 2017-03-01 MED ORDER — ONDANSETRON HCL 4 MG/2ML IJ SOLN
4.0000 mg | INTRAMUSCULAR | Status: AC
  Administered 2017-03-01: 4 mg via INTRAVENOUS
  Filled 2017-03-01: qty 2

## 2017-03-01 NOTE — Discharge Instructions (Addendum)
Although the infection on your leg does seem to have spread out from the original skin markings, overall your lab work looks better and your vital signs are stable and you have no signs of systemic illness.  There is no lesion on your leg that I could drain today.  I have prescribed a new antibiotic for you but you should continue taking the one previously prescribed as well.  Please complete the full course of antibiotics as prescribed; do not stop taking either one of them before they are completely gone, even if your leg gets better.  If your leg continues to get worse in spite of both antibiotics, with worsening redness, swelling, or if he develop fever or other new symptoms that concern you, please return to the emergency department.

## 2017-03-01 NOTE — ED Notes (Signed)
Report from hunter, rn.  

## 2017-03-01 NOTE — ED Provider Notes (Signed)
[facility] Emergency Department Provider Note  ____________________________________________   First MD Initiated Contact with Patient 03/01/17 1734     (approximate)  I have reviewed the triage vital signs and the nursing notes.   HISTORY  Chief Complaint No chief complaint on file.    HPI Spencer Schultz is a 26 y.o. male with no significant past medical history who presents for reevaluation of pain in his left lower leg thought to be secondary to an insect bite and subsequent cellulitis.  He was here 2 days ago for evaluation of an insect bite with some surrounding redness and pain.  He is not certain of the kind of insect that bit him.  He was started on Bactrimand given some Percocet and discharged.  The area was marked with a skin marker.  He reports that it has gradually gotten worse since that time with worsening pain that is most severe when he is standing but actually better when walking and better when sitting with his leg elevated.  He has tried to keep it elevated whenever possible and that does help the swelling.  He reports that the redness has extended beyond the borders marked 2 days ago.  He also reports that sometimes he is able to drain some yellow pus from the center of the erythematous region at the area thought to be the initial insect bite.  He denies fever/chills, chest pain, shortness of breath, nausea, vomiting, abdominal pain.  He states that the pain in his leg is getting worse and is severe as documented above.   No past medical history on file.  There are no active problems to display for this patient.   No past surgical history on file.  Prior to Admission medications   Medication Sig Start Date End Date Taking? Authorizing Provider  amoxicillin (AMOXIL) 500 MG tablet Take 1 tablet (500 mg total) by mouth 3 (three) times daily. 11/29/15   Ignacia Bayley, PA-C  cephALEXin (KEFLEX) 500 MG capsule Take 1 capsule (500 mg total) by  mouth 4 (four) times daily. 03/01/17   Loleta Rose, MD  cyclobenzaprine (FLEXERIL) 10 MG tablet Take 1 tablet (10 mg total) by mouth every 8 (eight) hours as needed for muscle spasms. 11/29/15   Ignacia Bayley, PA-C  ibuprofen (ADVIL,MOTRIN) 800 MG tablet Take 1 tablet (800 mg total) by mouth every 8 (eight) hours as needed. 11/29/15   Ignacia Bayley, PA-C  oxyCODONE-acetaminophen (ROXICET) 5-325 MG tablet Take 1-2 tablets by mouth every 4 (four) hours as needed for severe pain. 03/01/17   Loleta Rose, MD  sulfamethoxazole-trimethoprim (BACTRIM DS,SEPTRA DS) 800-160 MG tablet Take 1 tablet by mouth 2 (two) times daily. 02/28/17 03/10/17  Darci Current, MD    Allergies Patient has no known allergies.  No family history on file.  Social History Social History  Substance Use Topics  . Smoking status: Current Every Day Smoker  . Smokeless tobacco: Never Used  . Alcohol use No    Review of Systems Constitutional: No fever/chills Eyes: No visual changes. ENT: No sore throat. Cardiovascular: Denies chest pain. Respiratory: Denies shortness of breath. Gastrointestinal: No abdominal pain.  No nausea, no vomiting.  No diarrhea.  No constipation. Genitourinary: Negative for dysuria. Musculoskeletal: Severe pain in LLE around presumed insect bite and surrounding rash.  Negative for neck pain.  Negative for back pain. Integumentary: Spreading erythema around insect bite site in spite of treatment with Bactrim. Neurological: Negative for headaches, focal weakness or numbness.   ____________________________________________  PHYSICAL EXAM:  VITAL SIGNS: ED Triage Vitals [03/01/17 1617]  Enc Vitals Group     BP 140/87     Pulse Rate 87     Resp 18     Temp 98.7 F (37.1 C)     Temp Source Oral     SpO2 98 %     Weight 90.7 kg (200 lb)     Height 1.778 m (5\' 10" )     Head Circumference      Peak Flow      Pain Score      Pain Loc      Pain Edu?      Excl. in GC?      Constitutional: Alert and oriented. Well appearing but reports significant discomfort in LLE. Eyes: Conjunctivae are normal.  Head: Atraumatic. Cardiovascular: Normal rate, regular rhythm. Good peripheral circulation.  Respiratory: Normal respiratory effort.  No retractions.  Musculoskeletal: The patient has an erythematous region on his left lower leg that has extended distally beyond the initial skin markings down nearly to his ankle (multiple centimeters).  It is extended proximally by 1-2 cm but is very diffuse and difficult to establish a border.  The area is erythematous, warm to the touch, blanching, and consistent with cellulitis.  There is a central darker area with a spot in the middle where he identifies the initial bite and states that it occasionally drains purulent material.  However there is no fluctuance and no induration.  No edema of the extremity with soft and easily compressible compartments.  Neurovascularly intact distally Neurologic:  Normal speech and language. No gross focal neurologic deficits are appreciated.  Skin:  Skin is warm, dry and intact. No rash noted. Psychiatric: Mood and affect are normal. Speech and behavior are normal.  ____________________________________________   LABS (all labs ordered are listed, but only abnormal results are displayed)  Labs Reviewed  CBC WITH DIFFERENTIAL/PLATELET - Abnormal; Notable for the following:       Result Value   Neutro Abs 7.3 (*)    All other components within normal limits  COMPREHENSIVE METABOLIC PANEL - Abnormal; Notable for the following:    Creatinine, Ser 1.29 (*)    Calcium 8.8 (*)    All other components within normal limits   ____________________________________________  EKG  None - EKG not ordered by ED physician ____________________________________________  RADIOLOGY   No results found.  ____________________________________________   PROCEDURES  Critical Care performed:  No   Procedure(s) performed:   Procedures   ____________________________________________   INITIAL IMPRESSION / ASSESSMENT AND PLAN / ED COURSE  Pertinent labs & imaging results that were available during my care of the patient were reviewed by me and considered in my medical decision making (see chart for details).  The patient seems to have worsening cellulitis compared to the prior exam as demonstrated by cellulitis spreading from beyond the borders of the skin markings.  However he states also that the swelling is improving in spite of the persistent if not worsening pain.  He has normal vital signs, no tachycardia, no fever, and his white count is now 9.9 which is actually decreased from prior.  There is nothing on exam to suggest that there is a drainable abscess and there is a central area that occasionally has been draining some purulent material.  He therefore presents a mixed picture, but I do not feel he would benefit from inpatient treatment at this time.  He has been on Bactrim alone and I suspect  he would benefit from a cephalosporin for broader coverage of skin flora (staph and strep species).  Discussed this with the patient and his father and he understands and does not want to stay in the hospital if he does not have to.  I will treat him with a dose of ceftriaxone 1 g IV for broad initial coverage and discharge him on Keflex, instructed him to continue taking the Bactrim, and we will give him a little bit more pain medication.  I reiterated the return precautions and if this is not successful he will likely need admission.  However he has no signs or symptoms of systemic illness at this time.I reviewed the patient's prescription history over the last 12 months in the multi-state controlled substances database(s) that includes [place], [place], [place], [place], [place], [place], [place], [place], [place], [place], [place], [place], [place], and  [place].  Results were notable for  only the prescription provided and filled yesterday by Dr. Manson Passey and it was for 6 Percocet.  I will provide some more at this time given the obvious infection in his leg and I am not worried about secondary gain currently.       ____________________________________________  FINAL CLINICAL IMPRESSION(S) / ED DIAGNOSES  Final diagnoses:  Cellulitis of left lower leg     MEDICATIONS GIVEN DURING THIS VISIT:  Medications  morphine 4 MG/ML injection 4 mg (4 mg Intravenous Given 03/01/17 1816)  ondansetron (ZOFRAN) injection 4 mg (4 mg Intravenous Given 03/01/17 1816)  cefTRIAXone (ROCEPHIN) 1 g in dextrose 5 % 50 mL IVPB (0 g Intravenous Stopped 03/01/17 1904)     NEW OUTPATIENT MEDICATIONS STARTED DURING THIS VISIT:  Discharge Medication List as of 03/01/2017  7:21 PM    START taking these medications   Details  cephALEXin (KEFLEX) 500 MG capsule Take 1 capsule (500 mg total) by mouth 4 (four) times daily., Starting Sun 03/01/2017, Print        Discharge Medication List as of 03/01/2017  7:21 PM    CONTINUE these medications which have CHANGED   Details  oxyCODONE-acetaminophen (ROXICET) 5-325 MG tablet Take 1-2 tablets by mouth every 4 (four) hours as needed for severe pain., Starting Sun 03/01/2017, Print      Bactrim  Discharge Medication List as of 03/01/2017  7:21 PM       Note:  This document was prepared using Dragon voice recognition software and may include unintentional dictation errors.    Loleta Rose, MD 03/01/17 2045

## 2017-03-01 NOTE — ED Triage Notes (Signed)
Pt states he was here a couple days ago for infection to the left lower leg, pt states the pain is worse, the redness to the lower leg redness is spreading, pt states he is unable to put weight on it from the pain

## 2017-04-25 ENCOUNTER — Encounter: Payer: Self-pay | Admitting: Emergency Medicine

## 2017-04-25 ENCOUNTER — Emergency Department
Admission: EM | Admit: 2017-04-25 | Discharge: 2017-04-25 | Disposition: A | Discharge status: Home / Self Care | Payer: Self-pay | Attending: Emergency Medicine | Admitting: Emergency Medicine

## 2017-04-25 DIAGNOSIS — Z79899 Other long term (current) drug therapy: Secondary | ICD-10-CM | POA: Insufficient documentation

## 2017-04-25 DIAGNOSIS — H60393 Other infective otitis externa, bilateral: Secondary | ICD-10-CM | POA: Insufficient documentation

## 2017-04-25 DIAGNOSIS — F17210 Nicotine dependence, cigarettes, uncomplicated: Secondary | ICD-10-CM | POA: Insufficient documentation

## 2017-04-25 DIAGNOSIS — H6983 Other specified disorders of Eustachian tube, bilateral: Secondary | ICD-10-CM | POA: Insufficient documentation

## 2017-04-25 DIAGNOSIS — B9689 Other specified bacterial agents as the cause of diseases classified elsewhere: Secondary | ICD-10-CM | POA: Insufficient documentation

## 2017-04-25 DIAGNOSIS — J329 Chronic sinusitis, unspecified: Secondary | ICD-10-CM | POA: Insufficient documentation

## 2017-04-25 MED ORDER — AMOXICILLIN-POT CLAVULANATE 875-125 MG PO TABS: 1.0000 | ORAL_TABLET | Freq: Two times a day (BID) | ORAL | 0 refills | Status: DC

## 2017-04-25 MED ORDER — FLUTICASONE PROPIONATE 50 MCG/ACT NA SUSP: 1.0000 | Freq: Two times a day (BID) | NASAL | 0 refills | Status: DC

## 2017-04-25 MED ORDER — NEOMYCIN-POLYMYXIN-HC 3.5-10000-1 OT SOLN: 3.0000 [drp] | Freq: Three times a day (TID) | OTIC | 0 refills | Status: AC

## 2017-04-25 MED ORDER — CETIRIZINE HCL 10 MG PO TABS: 10.0000 mg | ORAL_TABLET | Freq: Every day | ORAL | 0 refills | Status: DC

## 2017-04-25 NOTE — ED Triage Notes (Signed)
Bilateral ear discomfort, hearing decrease and dome drainage x 3 days. Patient is able to answer questions spoken with normal volume at triage.

## 2017-04-25 NOTE — ED Notes (Signed)
Pt verbalized understanding of discharge instructions. NAD at this time. 

## 2017-04-25 NOTE — ED Notes (Signed)
Signature pad not working. Hard copy printed and signed by pt.  

## 2017-04-25 NOTE — ED Provider Notes (Signed)
[facility] Emergency Department Provider Note  ____________________________________________  Time seen: Approximately 3:09 PM  I have reviewed the triage vital signs and the nursing notes.   HISTORY  Chief Complaint Hearing Problem and Otalgia    HPI Spencer Schultz is a 26 y.o. male who presents emergency department complaining of sinus congestion, sneezing, sinus pressure, ear pressure, decreased hearing, tinnitus bilaterally. Patient reports that symptoms have been ongoing 2 weeks. He first noticed nasal congestion but his main complaint today is ear fullness and ringing in his ears. Patient reports that he is not taken any medications by mouth for this complaint. He has tried candle wax to try and get the cerumen out of his ears. Patient denies any barotrauma. No recent swimming. No fevers or chills. No sore throat. No coughing. No other complaints at this time.   History reviewed. No pertinent past medical history.  There are no active problems to display for this patient.   History reviewed. No pertinent surgical history.  Prior to Admission medications   Medication Sig Start Date End Date Taking? Authorizing Provider  amoxicillin (AMOXIL) 500 MG tablet Take 1 tablet (500 mg total) by mouth 3 (three) times daily. 11/29/15   [name], Bralin, PA-C  amoxicillin-clavulanate (AUGMENTIN) 875-125 MG tablet Take 1 tablet by mouth 2 (two) times daily. 04/25/17   Cuthriell, Delorise RoyalsJonathan D, PA-C  cephALEXin (KEFLEX) 500 MG capsule Take 1 capsule (500 mg total) by mouth 4 (four) times daily. 03/01/17   [name], [name], MD  cetirizine (ZYRTEC) 10 MG tablet Take 1 tablet (10 mg total) by mouth daily. 04/25/17   Cuthriell, Delorise RoyalsJonathan D, PA-C  cyclobenzaprine (FLEXERIL) 10 MG tablet Take 1 tablet (10 mg total) by mouth every 8 (eight) hours as needed for muscle spasms. 11/29/15   [name], Darreon, PA-C  fluticasone (FLONASE) 50 MCG/ACT nasal spray Place 1 spray into both nostrils 2 (two)  times daily. 04/25/17   Cuthriell, Delorise RoyalsJonathan D, PA-C  ibuprofen (ADVIL,MOTRIN) 800 MG tablet Take 1 tablet (800 mg total) by mouth every 8 (eight) hours as needed. 11/29/15   [name], Marsel, PA-C  neomycin-polymyxin-hydrocortisone (CORTISPORIN) OTIC solution Place 3 drops into both ears 3 (three) times daily. 04/25/17 05/05/17  Cuthriell, Delorise RoyalsJonathan D, PA-C  oxyCODONE-acetaminophen (ROXICET) 5-325 MG tablet Take 1-2 tablets by mouth every 4 (four) hours as needed for severe pain. 03/01/17   [name], [name], MD    Allergies Patient has no known allergies.  No family history on file.  Social History Social History  Substance Use Topics  . Smoking status: Current Every Day Smoker    Packs/day: 1.00    Types: Cigarettes  . Smokeless tobacco: Never Used  . Alcohol use No     Review of Systems  Constitutional: No fever/chills Eyes: No visual changes. No discharge ENT: Positive for nasal congestion and sinus pressure. Positive for bilateral ear pressure, tinnitus, full sensation. Cardiovascular: no chest pain. Respiratory: no cough. No SOB. Gastrointestinal: No abdominal pain.  No nausea, no vomiting.   Musculoskeletal: Negative for musculoskeletal pain. Skin: Negative for rash, abrasions, lacerations, ecchymosis. Neurological: Negative for headaches, focal weakness or numbness. 10-point ROS otherwise negative.  ____________________________________________   PHYSICAL EXAM:  VITAL SIGNS: ED Triage Vitals [04/25/17 1423]  Enc Vitals Group     BP (!) 151/89     Pulse Rate 62     Resp 18     Temp 98.4 F (36.9 C)     Temp Source Oral     SpO2 99 %  Weight 210 lb (95.3 kg)     Height 5\' 10"  (1.778 m)     Head Circumference      Peak Flow      Pain Score 10     Pain Loc      Pain Edu?      Excl. in GC?      Constitutional: Alert and oriented. Well appearing and in no acute distress. Eyes: Conjunctivae are normal. PERRL. EOMI. Head: Atraumatic. ENT:      Ears: EACs are  erythematous, abraised, residual earwax visualized. TMs are not well visualized, with R is mildly bulging but not dusky. No mucoid air-fluid level identified. No puncture wounds to TMs visualized.      Nose: Moderate congestion/rhinnorhea. Maxillary sinuses are tender to percussion.      Mouth/Throat: Mucous membranes are moist. Oropharynx is nonerythematous and nonedematous. Neck: No stridor.   Hematological/Lymphatic/Immunilogical: No cervical lymphadenopathy.  Cardiovascular: Normal rate, regular rhythm. Normal S1 and S2.  Good peripheral circulation. Respiratory: Normal respiratory effort without tachypnea or retractions. Lungs CTAB. Good air entry to the bases with no decreased or absent breath sounds. Musculoskeletal: Full range of motion to all extremities. No gross deformities appreciated. Neurologic:  Normal speech and language. No gross focal neurologic deficits are appreciated.  Skin:  Skin is warm, dry and intact. No rash noted. Psychiatric: Mood and affect are normal. Speech and behavior are normal. Patient exhibits appropriate insight and judgement.   ____________________________________________   LABS (all labs ordered are listed, but only abnormal results are displayed)  Labs Reviewed - No data to display ____________________________________________  EKG   ____________________________________________  RADIOLOGY   No results found.  ____________________________________________    PROCEDURES  Procedure(s) performed:    Procedures    Medications - No data to display   ____________________________________________   INITIAL IMPRESSION / ASSESSMENT AND PLAN / ED COURSE  Pertinent labs & imaging results that were available during my care of the patient were reviewed by me and considered in my medical decision making (see chart for details).  Review of the [place] CSRS was performed in accordance of the [facility] prior to dispensing any controlled drugs.      Patient's diagnosis is consistent with maxillary sinusitis with eustachian tube dysfunction. Patient also has otitis externa from candle wax application.. Patient will be discharged home with prescriptions for oral antibiotics as well as antibiotic eardrops and allergy medications. Patient is to follow up with per my care as needed or otherwise directed. Patient is given ED precautions to return to the ED for any worsening or new symptoms.     ____________________________________________  FINAL CLINICAL IMPRESSION(S) / ED DIAGNOSES  Final diagnoses:  Bacterial sinusitis  Other infective acute otitis externa of both ears  Eustachian tube dysfunction, bilateral      NEW MEDICATIONS STARTED DURING THIS VISIT:  New Prescriptions   AMOXICILLIN-CLAVULANATE (AUGMENTIN) 875-125 MG TABLET    Take 1 tablet by mouth 2 (two) times daily.   CETIRIZINE (ZYRTEC) 10 MG TABLET    Take 1 tablet (10 mg total) by mouth daily.   FLUTICASONE (FLONASE) 50 MCG/ACT NASAL SPRAY    Place 1 spray into both nostrils 2 (two) times daily.   NEOMYCIN-POLYMYXIN-HYDROCORTISONE (CORTISPORIN) OTIC SOLUTION    Place 3 drops into both ears 3 (three) times daily.        This chart was dictated using voice recognition software/Dragon. Despite best efforts to proofread, errors can occur which can change the meaning. Any change was purely  unintentional.    Racheal Patches, PA-C 04/25/17 1528    Phineas Semen, MD 04/25/17 [phone]

## 2017-05-17 ENCOUNTER — Ambulatory Visit
Admission: AD | Admit: 2017-05-17 | Discharge: 2017-05-17 | Disposition: A | Discharge status: Home / Self Care | Attending: Family Medicine | Admitting: Family Medicine

## 2017-05-17 NOTE — ED Notes (Signed)
Patient here for forensic blood draw for [facility].  Area to right antecub cleansed with betadine per protocol.  Blood obtained and given to [employer] following chain of custody.  Patient tolerated blood draw well.

## 2018-01-08 ENCOUNTER — Emergency Department: Payer: Self-pay

## 2018-01-08 ENCOUNTER — Emergency Department
Admission: EM | Admit: 2018-01-08 | Discharge: 2018-01-08 | Disposition: A | Discharge status: Home / Self Care | Payer: Self-pay | Attending: Emergency Medicine | Admitting: Emergency Medicine

## 2018-01-08 ENCOUNTER — Encounter: Payer: Self-pay | Admitting: Emergency Medicine

## 2018-01-08 DIAGNOSIS — F17210 Nicotine dependence, cigarettes, uncomplicated: Secondary | ICD-10-CM | POA: Insufficient documentation

## 2018-01-08 DIAGNOSIS — L03811 Cellulitis of head [any part, except face]: Secondary | ICD-10-CM | POA: Insufficient documentation

## 2018-01-08 DIAGNOSIS — Z79899 Other long term (current) drug therapy: Secondary | ICD-10-CM | POA: Insufficient documentation

## 2018-01-08 LAB — BASIC METABOLIC PANEL
Anion gap: 7 (ref 5–15)
BUN: 19 mg/dL (ref 6–20)
CALCIUM: 9 mg/dL (ref 8.9–10.3)
CO2: 23 mmol/L (ref 22–32)
CREATININE: 0.95 mg/dL (ref 0.61–1.24)
Chloride: 107 mmol/L (ref 101–111)
GFR calc non Af Amer: >60 mL/min (ref >60)
Glucose, Bld: 105 mg/dL — ABNORMAL HIGH (ref 65–99)
Potassium: 3.5 mmol/L (ref 3.5–5.1)
SODIUM: 137 mmol/L (ref 135–145)

## 2018-01-08 LAB — CBC WITH DIFFERENTIAL/PLATELET
BASOS PCT: 0 %
Basophils Absolute: 0.1 10*3/uL (ref 0–0.1)
EOS ABS: 0 10*3/uL (ref 0–0.7)
Eosinophils Relative: 0 %
HCT: 44.8 % (ref 40.0–52.0)
Hemoglobin: 15.5 g/dL (ref 13.0–18.0)
Lymphocytes Relative: 11 %
Lymphs Abs: 1.6 10*3/uL (ref 1.0–3.6)
MCH: 31.8 pg (ref 26.0–34.0)
MCHC: 34.6 g/dL (ref 32.0–36.0)
MCV: 91.8 fL (ref 80.0–100.0)
MONO ABS: 1.3 10*3/uL — AB (ref 0.2–1.0)
Monocytes Relative: 9 %
NEUTROS PCT: 80 %
Neutro Abs: 11.3 10*3/uL — ABNORMAL HIGH (ref 1.4–6.5)
PLATELETS: 219 10*3/uL (ref 150–440)
RBC: 4.88 MIL/uL (ref 4.40–5.90)
RDW: 14.1 % (ref 11.5–14.5)
WBC: 14.3 10*3/uL — ABNORMAL HIGH (ref 3.8–10.6)

## 2018-01-08 MED ORDER — CLINDAMYCIN HCL 300 MG PO CAPS: 300.0000 mg | ORAL_CAPSULE | Freq: Three times a day (TID) | ORAL | 0 refills | Status: AC

## 2018-01-08 MED ORDER — HYDROCODONE-ACETAMINOPHEN 5-325 MG PO TABS
1.0000 | ORAL_TABLET | Freq: Once | ORAL | Status: AC
  Administered 2018-01-08: 1 via ORAL
  Filled 2018-01-08: qty 1

## 2018-01-08 MED ORDER — HYDROCODONE-ACETAMINOPHEN 5-325 MG PO TABS: 1.0000 | ORAL_TABLET | ORAL | 0 refills | Status: AC | PRN

## 2018-01-08 MED ORDER — CLINDAMYCIN PHOSPHATE 600 MG/50ML IV SOLN
600.0000 mg | Freq: Once | INTRAVENOUS | Status: AC
Start: 2018-01-08 — End: 2018-01-08
  Administered 2018-01-08: 600 mg via INTRAVENOUS
  Filled 2018-01-08: qty 50

## 2018-01-08 MED ORDER — IOHEXOL 300 MG/ML  SOLN
75.0000 mL | Freq: Once | INTRAMUSCULAR | Status: AC | PRN
  Administered 2018-01-08: 75 mL via INTRAVENOUS
  Filled 2018-01-08: qty 75

## 2018-01-08 NOTE — ED Notes (Signed)
Patient transported to CT 

## 2018-01-08 NOTE — ED Triage Notes (Signed)
Patient presents to the ED with reddened swollen area to the back of his head.  Patient appears very uncomfortable.  Patient reports he first noticed area 2 days ago.  Patient states, "I tried to pop it, but it wouldn't pop".  Patient reports history of cellulitis.

## 2018-01-08 NOTE — ED Notes (Signed)
Alert and oriented. ambulatory

## 2018-01-08 NOTE — Discharge Instructions (Signed)
Return to the emergency department for symptoms of change or worsen if you are unable to schedule an appointment.

## 2018-01-08 NOTE — ED Provider Notes (Signed)
[facility] Emergency Department Provider Note  ____________________________________________  Time seen: Approximately 8:48 AM  I have reviewed the triage vital signs and the nursing notes.   HISTORY  Chief Complaint Abscess   HPI Spencer PughRobert Schultz is a 27 y.o. male who presents to the emergency department for evaluation treatment of a lesion on the back of his head and neck that has been present for the past 3 days.  He states that over the past 24 hours it has become increasingly painful.  He states that he squeezed it and attempt to pop it and relieve the pressure but it did not work.  He has a significant past medical history of cellulitis to his left lower extremity, otherwise has been healthy.  History reviewed. No pertinent past medical history.  There are no active problems to display for this patient.   Past Surgical History:  Procedure Laterality Date  . TONSILLECTOMY      Prior to Admission medications   Medication Sig Start Date End Date Taking? Authorizing Provider  amoxicillin (AMOXIL) 500 MG tablet Take 1 tablet (500 mg total) by mouth 3 (three) times daily. 11/29/15   [name], Camdon, PA-C  amoxicillin-clavulanate (AUGMENTIN) 875-125 MG tablet Take 1 tablet by mouth 2 (two) times daily. 04/25/17   Cuthriell, Delorise RoyalsJonathan D, PA-C  cephALEXin (KEFLEX) 500 MG capsule Take 1 capsule (500 mg total) by mouth 4 (four) times daily. 03/01/17   [name], [name], MD  cetirizine (ZYRTEC) 10 MG tablet Take 1 tablet (10 mg total) by mouth daily. 04/25/17   Cuthriell, Delorise RoyalsJonathan D, PA-C  clindamycin (CLEOCIN) 300 MG capsule Take 1 capsule (300 mg total) by mouth 3 (three) times daily for 10 days. 01/08/18 01/18/18  Kahlani Graber, Kasandra Knudsenari B, FNP  cyclobenzaprine (FLEXERIL) 10 MG tablet Take 1 tablet (10 mg total) by mouth every 8 (eight) hours as needed for muscle spasms. 11/29/15   [name], Dontaye, PA-C  fluticasone (FLONASE) 50 MCG/ACT nasal spray Place 1 spray into both nostrils 2 (two)  times daily. 04/25/17   Cuthriell, Delorise RoyalsJonathan D, PA-C  HYDROcodone-acetaminophen (NORCO/VICODIN) 5-325 MG tablet Take 1 tablet by mouth every 4 (four) hours as needed for moderate pain. 01/08/18 01/08/19  Labella Zahradnik, Kasandra Knudsenari B, FNP  ibuprofen (ADVIL,MOTRIN) 800 MG tablet Take 1 tablet (800 mg total) by mouth every 8 (eight) hours as needed. 11/29/15   [name], Tylen, PA-C  oxyCODONE-acetaminophen (ROXICET) 5-325 MG tablet Take 1-2 tablets by mouth every 4 (four) hours as needed for severe pain. 03/01/17   [name], [name], MD    Allergies Patient has no known allergies.  No family history on file.  Social History Social History   Tobacco Use  . Smoking status: Current Every Day Smoker    Packs/day: 1.00    Types: Cigarettes  . Smokeless tobacco: Never Used  Substance Use Topics  . Alcohol use: No  . Drug use: Not on file    Review of Systems  Constitutional: Negative for fever. Respiratory: Negative negative for cough or shortness of breath.  Musculoskeletal: Negative for myalgias Skin: Positive for tender, raised lesion to the posterior scalp and neck. Neurological: Negative for numbness or paresthesias. ____________________________________________   PHYSICAL EXAM:  VITAL SIGNS: ED Triage Vitals  Enc Vitals Group     BP 01/08/18 0825 (!) 145/89     Pulse Rate 01/08/18 0825 94     Resp 01/08/18 0825 18     Temp 01/08/18 0825 98 F (36.7 C)     Temp Source 01/08/18 0825 Oral  SpO2 01/08/18 0825 100 %     Weight 01/08/18 0826 200 lb (90.7 kg)     Height 01/08/18 0826 5\' 9"  (1.753 m)     Head Circumference --      Peak Flow --      Pain Score 01/08/18 0825 10     Pain Loc --      Pain Edu? --      Excl. in GC? --      Constitutional: Uncomfortable appearing. Eyes: Conjunctivae are clear without discharge or drainage. Nose: No rhinorrhea noted. Mouth/Throat: Airway is patent.  Neck: No stridor. Unrestricted range of motion observed. Lymphatic: No palpable posterior or  anterior cervical nodes Cardiovascular: Capillary refill is <3 seconds.  Respiratory: Respirations are even and unlabored.. Musculoskeletal: Unrestricted range of motion observed. Neurologic: Awake, alert, and oriented x 4.  Skin: 8 cm x 4 cm area of erythematous, tender non-fluctuant lesion to the posterior aspect of the hairline and pre-cervical area.  ____________________________________________   LABS (all labs ordered are listed, but only abnormal results are displayed)  Labs Reviewed  CBC WITH DIFFERENTIAL/PLATELET - Abnormal; Notable for the following components:      Result Value   WBC 14.3 (*)    Neutro Abs 11.3 (*)    Monocytes Absolute 1.3 (*)    All other components within normal limits  BASIC METABOLIC PANEL - Abnormal; Notable for the following components:   Glucose, Bld 105 (*)    All other components within normal limits   ____________________________________________  EKG  Not indicated ____________________________________________  RADIOLOGY  CT exam shows cellulitis in the midline, sub-occipital region without focal fluid collection or drainable abscess with some localized skin thickening. ____________________________________________   PROCEDURES  Procedures ____________________________________________   INITIAL IMPRESSION / ASSESSMENT AND PLAN / ED COURSE  Spencer Schultz is a 27 y.o. male who presents to the emergency department for evaluation treatment of a tender lesion to the back of his head and upper neck.  CT and exam are both consistent with cellulitis.  He was given IV clindamycin while here in the department today.  He will be discharged home with a prescription for clindamycin and encouraged to return in 2 days if he has not noticed significant improvement.  He is to follow-up with his primary care provider next week.  He was advised to return to the ER sooner for symptoms that change, worsen, or for new concerns.  Medications   HYDROcodone-acetaminophen (NORCO/VICODIN) 5-325 MG per tablet 1 tablet (1 tablet Oral Given 01/08/18 0901)  iohexol (OMNIPAQUE) 300 MG/ML solution 75 mL (75 mLs Intravenous Contrast Given 01/08/18 1005)  clindamycin (CLEOCIN) IVPB 600 mg (0 mg Intravenous Stopped 01/08/18 1113)     Pertinent labs & imaging results that were available during my care of the patient were reviewed by me and considered in my medical decision making (see chart for details). ____________________________________________   FINAL CLINICAL IMPRESSION(S) / ED DIAGNOSES  Final diagnoses:  Cellulitis of head except face    ED Discharge Orders        Ordered    clindamycin (CLEOCIN) 300 MG capsule  3 times daily     01/08/18 1124    HYDROcodone-acetaminophen (NORCO/VICODIN) 5-325 MG tablet  Every 4 hours PRN     01/08/18 1124       Note:  This document was prepared using Dragon voice recognition software and may include unintentional dictation errors.    Chinita Pester, FNP 01/08/18 1148    [place], [place],  MD 01/08/18 1319

## 2018-01-10 ENCOUNTER — Other Ambulatory Visit: Payer: Self-pay

## 2018-01-10 ENCOUNTER — Inpatient Hospital Stay
Admission: EM | Admit: 2018-01-10 | Discharge: 2018-01-12 | DRG: 603 | Disposition: A | Discharge status: Home / Self Care | Attending: Family Medicine | Admitting: Family Medicine

## 2018-01-10 ENCOUNTER — Encounter: Payer: Self-pay | Admitting: Emergency Medicine

## 2018-01-10 ENCOUNTER — Emergency Department

## 2018-01-10 DIAGNOSIS — A419 Sepsis, unspecified organism: Secondary | ICD-10-CM

## 2018-01-10 DIAGNOSIS — E872 Acidosis, unspecified: Secondary | ICD-10-CM

## 2018-01-10 DIAGNOSIS — L03221 Cellulitis of neck: Principal | ICD-10-CM | POA: Diagnosis present

## 2018-01-10 DIAGNOSIS — F17210 Nicotine dependence, cigarettes, uncomplicated: Secondary | ICD-10-CM | POA: Diagnosis present

## 2018-01-10 DIAGNOSIS — L03811 Cellulitis of head [any part, except face]: Secondary | ICD-10-CM

## 2018-01-10 DIAGNOSIS — L02811 Cutaneous abscess of head [any part, except face]: Secondary | ICD-10-CM

## 2018-01-10 LAB — URINE DRUG SCREEN, QUALITATIVE (ARMC ONLY)
Amphetamines, Ur Screen: NOT DETECTED
BARBITURATES, UR SCREEN: NOT DETECTED
BENZODIAZEPINE, UR SCRN: POSITIVE — AB
CANNABINOID 50 NG, UR ~~LOC~~: POSITIVE — AB
Cocaine Metabolite,Ur ~~LOC~~: POSITIVE — AB
MDMA (Ecstasy)Ur Screen: NOT DETECTED
Methadone Scn, Ur: NOT DETECTED
OPIATE, UR SCREEN: POSITIVE — AB
PHENCYCLIDINE (PCP) UR S: NOT DETECTED
Tricyclic, Ur Screen: NOT DETECTED

## 2018-01-10 LAB — URINALYSIS, COMPLETE (UACMP) WITH MICROSCOPIC
BACTERIA UA: NONE SEEN
BILIRUBIN URINE: NEGATIVE
Glucose, UA: NEGATIVE mg/dL
HGB URINE DIPSTICK: NEGATIVE
KETONES UR: NEGATIVE mg/dL
LEUKOCYTES UA: NEGATIVE
Nitrite: NEGATIVE
PROTEIN: 30 mg/dL — AB
SPECIFIC GRAVITY, URINE: 1.027 (ref 1.005–1.030)
SQUAMOUS EPITHELIAL / LPF: NONE SEEN
pH: 5 (ref 5.0–8.0)

## 2018-01-10 LAB — CBC WITH DIFFERENTIAL/PLATELET
BASOS ABS: 0.1 10*3/uL (ref 0–0.1)
Basophils Relative: 0 %
Eosinophils Absolute: 0 10*3/uL (ref 0–0.7)
Eosinophils Relative: 0 %
HEMATOCRIT: 49.7 % (ref 40.0–52.0)
HEMOGLOBIN: 16.8 g/dL (ref 13.0–18.0)
LYMPHS PCT: 11 %
Lymphs Abs: 1.8 10*3/uL (ref 1.0–3.6)
MCH: 31 pg (ref 26.0–34.0)
MCHC: 33.7 g/dL (ref 32.0–36.0)
MCV: 91.9 fL (ref 80.0–100.0)
MONO ABS: 1.1 10*3/uL — AB (ref 0.2–1.0)
MONOS PCT: 6 %
NEUTROS ABS: 13.7 10*3/uL — AB (ref 1.4–6.5)
NEUTROS PCT: 83 %
Platelets: 228 10*3/uL (ref 150–440)
RBC: 5.41 MIL/uL (ref 4.40–5.90)
RDW: 14.1 % (ref 11.5–14.5)
WBC: 16.6 10*3/uL — ABNORMAL HIGH (ref 3.8–10.6)

## 2018-01-10 LAB — COMPREHENSIVE METABOLIC PANEL
ALT: 17 U/L (ref 17–63)
ANION GAP: 8 (ref 5–15)
AST: 24 U/L (ref 15–41)
Albumin: 4.3 g/dL (ref 3.5–5.0)
Alkaline Phosphatase: 65 U/L (ref 38–126)
BUN: 12 mg/dL (ref 6–20)
CO2: 27 mmol/L (ref 22–32)
Calcium: 9.4 mg/dL (ref 8.9–10.3)
Chloride: 102 mmol/L (ref 101–111)
Creatinine, Ser: 1.1 mg/dL (ref 0.61–1.24)
GFR calc Af Amer: >60 mL/min (ref >60)
GFR calc non Af Amer: >60 mL/min (ref >60)
GLUCOSE: 157 mg/dL — AB (ref 65–99)
POTASSIUM: 4.6 mmol/L (ref 3.5–5.1)
SODIUM: 137 mmol/L (ref 135–145)
TOTAL PROTEIN: 7.7 g/dL (ref 6.5–8.1)
Total Bilirubin: 0.7 mg/dL (ref 0.3–1.2)

## 2018-01-10 LAB — LACTIC ACID, PLASMA
LACTIC ACID, VENOUS: 2.9 mmol/L — AB (ref 0.5–1.9)
LACTIC ACID, VENOUS: 1.5 mmol/L (ref 0.5–1.9)

## 2018-01-10 MED ORDER — FENTANYL CITRATE (PF) 100 MCG/2ML IJ SOLN
100.0000 ug | Freq: Once | INTRAMUSCULAR | Status: AC
  Administered 2018-01-10: 100 ug via INTRAVENOUS
  Filled 2018-01-10: qty 2

## 2018-01-10 MED ORDER — SODIUM CHLORIDE 0.9 % IV BOLUS
1000.0000 mL | Freq: Once | INTRAVENOUS | Status: AC
  Administered 2018-01-10: 1000 mL via INTRAVENOUS

## 2018-01-10 MED ORDER — VANCOMYCIN HCL 10 G IV SOLR
1250.0000 mg | Freq: Two times a day (BID) | INTRAVENOUS | Status: DC
  Administered 2018-01-11 – 2018-01-12 (×4): 1250 mg via INTRAVENOUS
  Filled 2018-01-10 (×6): qty 1250

## 2018-01-10 MED ORDER — VANCOMYCIN HCL IN DEXTROSE 1-5 GM/200ML-% IV SOLN
1000.0000 mg | Freq: Once | INTRAVENOUS | Status: AC
  Administered 2018-01-10: 1000 mg via INTRAVENOUS
  Filled 2018-01-10: qty 200

## 2018-01-10 MED ORDER — IOPAMIDOL (ISOVUE-300) INJECTION 61%: 75.0000 mL | Freq: Once | INTRAVENOUS | Status: DC | PRN

## 2018-01-10 MED ORDER — ONDANSETRON HCL 4 MG/2ML IJ SOLN: 4.0000 mg | Freq: Four times a day (QID) | INTRAMUSCULAR | Status: DC | PRN

## 2018-01-10 MED ORDER — ACETAMINOPHEN 325 MG PO TABS: 650.0000 mg | ORAL_TABLET | Freq: Four times a day (QID) | ORAL | Status: DC | PRN

## 2018-01-10 MED ORDER — ONDANSETRON HCL 4 MG PO TABS: 4.0000 mg | ORAL_TABLET | Freq: Four times a day (QID) | ORAL | Status: DC | PRN

## 2018-01-10 MED ORDER — SODIUM CHLORIDE 0.9 % IV SOLN
3.0000 g | Freq: Three times a day (TID) | INTRAVENOUS | Status: DC
  Administered 2018-01-11 (×2): 3 g via INTRAVENOUS
  Filled 2018-01-10 (×4): qty 3

## 2018-01-10 MED ORDER — OXYCODONE-ACETAMINOPHEN 7.5-325 MG PO TABS
1.0000 | ORAL_TABLET | Freq: Four times a day (QID) | ORAL | Status: DC | PRN
  Administered 2018-01-10 – 2018-01-11 (×3): 1 via ORAL
  Filled 2018-01-10 (×3): qty 1

## 2018-01-10 MED ORDER — IBUPROFEN 400 MG PO TABS
400.0000 mg | ORAL_TABLET | Freq: Four times a day (QID) | ORAL | Status: DC | PRN
  Administered 2018-01-10: 400 mg via ORAL
  Filled 2018-01-10: qty 1

## 2018-01-10 MED ORDER — CEFAZOLIN SODIUM-DEXTROSE 1-4 GM/50ML-% IV SOLN
1.0000 g | Freq: Once | INTRAVENOUS | Status: AC
  Administered 2018-01-10: 1 g via INTRAVENOUS
  Filled 2018-01-10: qty 50

## 2018-01-10 MED ORDER — ACETAMINOPHEN 650 MG RE SUPP: 650.0000 mg | Freq: Four times a day (QID) | RECTAL | Status: DC | PRN

## 2018-01-10 MED ORDER — ENOXAPARIN SODIUM 40 MG/0.4ML ~~LOC~~ SOLN
40.0000 mg | SUBCUTANEOUS | Status: DC
  Administered 2018-01-10 – 2018-01-11 (×2): 40 mg via SUBCUTANEOUS
  Filled 2018-01-10 (×2): qty 0.4

## 2018-01-10 MED ORDER — ALBUTEROL SULFATE (2.5 MG/3ML) 0.083% IN NEBU: 2.5000 mg | INHALATION_SOLUTION | RESPIRATORY_TRACT | Status: DC | PRN

## 2018-01-10 MED ORDER — POLYETHYLENE GLYCOL 3350 17 G PO PACK: 17.0000 g | PACK | Freq: Every day | ORAL | Status: DC | PRN

## 2018-01-10 MED ORDER — IOHEXOL 300 MG/ML  SOLN
75.0000 mL | Freq: Once | INTRAMUSCULAR | Status: AC | PRN
  Administered 2018-01-10: 75 mL via INTRAVENOUS

## 2018-01-10 NOTE — ED Notes (Signed)
Called report to floor RN. Patient to go to room 221.

## 2018-01-10 NOTE — ED Notes (Signed)
Pt called from [facility] to treatment room, no response 

## 2018-01-10 NOTE — ED Triage Notes (Signed)
Pt called from [facility] to triage room, no response 

## 2018-01-10 NOTE — ED Triage Notes (Signed)
Attempting to get in contact with patient.

## 2018-01-10 NOTE — ED Provider Notes (Addendum)
[facility] Emergency Department Provider Note  ____________________________________________   First MD Initiated Contact with Patient 01/10/18 1506     (approximate)  I have reviewed the triage vital signs and the nursing notes.   HISTORY  Chief Complaint Cellulitis   HPI Spencer Schultz is a [age] y.o. male who self presents the emergency department with roughly 1 week of slowly progressive gradual onset now moderate to severe painful swelling to the back of his head.  He was seen in our emergency department 2 days ago where he had a CT scan of the back of his head performed which was negative for abscess and he was prescribed clindamycin.  He reports compliance with his medications however his symptoms have progressed and are now severe progressing down the left side of his neck posteriorly.  He denies fevers or chills.  His pain is worse with movement improved with rest.  History reviewed. No pertinent past medical history.  Patient Active Problem List   Diagnosis Date Noted  . Cellulitis, neck 01/10/2018    Past Surgical History:  Procedure Laterality Date  . TONSILLECTOMY      Prior to Admission medications   Medication Sig Start Date End Date Taking? Authorizing Provider  clindamycin (CLEOCIN) 300 MG capsule Take 1 capsule (300 mg total) by mouth 3 (three) times daily for 10 days. 01/08/18 01/18/18 Yes Triplett, Cari B, FNP  amoxicillin-clavulanate (AUGMENTIN) 875-125 MG tablet Take 1 tablet by mouth 2 (two) times daily for 10 days. 01/12/18 01/22/18  Salary, Evelena AsaMontell D, MD  cetirizine (ZYRTEC) 10 MG tablet Take 1 tablet (10 mg total) by mouth daily. Patient not taking: Reported on 01/10/2018 04/25/17   Cuthriell, Delorise RoyalsJonathan D, PA-C  cyclobenzaprine (FLEXERIL) 10 MG tablet Take 1 tablet (10 mg total) by mouth every 8 (eight) hours as needed for muscle spasms. Patient not taking: Reported on 01/10/2018 11/29/15   [name], Pearlie, PA-C  fluticasone [facility](FLONASE) 50  MCG/ACT nasal spray Place 1 spray into both nostrils 2 (two) times daily. Patient not taking: Reported on 01/10/2018 04/25/17   Cuthriell, Delorise RoyalsJonathan D, PA-C  HYDROcodone-acetaminophen (NORCO/VICODIN) 5-325 MG tablet Take 1 tablet by mouth every 4 (four) hours as needed for moderate pain. Patient not taking: Reported on 01/10/2018 01/08/18 01/08/19  Chinita Pesterriplett, Cari B, FNP  ibuprofen (ADVIL,MOTRIN) 800 MG tablet Take 1 tablet (800 mg total) by mouth every 8 (eight) hours as needed. Patient not taking: Reported on 01/10/2018 11/29/15   [name], Akiel, PA-C  oxyCODONE-acetaminophen (ROXICET) 5-325 MG tablet Take 1-2 tablets by mouth every 4 (four) hours as needed for severe pain. Patient not taking: Reported on 01/10/2018 03/01/17   [name], [name], MD    Allergies Patient has no known allergies.  Family History  Problem Relation Age of Onset  . Skin cancer Neg Hx     Social History Social History   Tobacco Use  . Smoking status: Current Every Day Smoker    Packs/day: 1.00    Types: Cigarettes  . Smokeless tobacco: Never Used  Substance Use Topics  . Alcohol use: No  . Drug use: Yes    Types: Marijuana    Review of Systems Constitutional: No fever/chills Eyes: No visual changes. ENT: Positive for neck pain. Cardiovascular: Denies chest pain. Respiratory: Denies shortness of breath. Gastrointestinal: No abdominal pain.  No nausea, no vomiting.  No diarrhea.  No constipation. Genitourinary: Negative for dysuria. Musculoskeletal: Negative for back pain. Skin: Positive for rash. Neurological: Negative for headaches, focal weakness or numbness.  ____________________________________________   PHYSICAL EXAM:  VITAL SIGNS: ED Triage Vitals  Enc Vitals Group     BP 01/10/18 1116 (!) 156/78     Pulse Rate 01/10/18 1116 96     Resp 01/10/18 1116 18     Temp 01/10/18 1116 98.5 F (36.9 C)     Temp Source 01/10/18 1116 Oral     SpO2 01/10/18 1116 98 %     Weight 01/10/18 1117 200 lb (90.7  kg)     Height 01/10/18 1117 5\' 9"  (1.753 m)     Head Circumference --      Peak Flow --      Pain Score 01/10/18 1117 10     Pain Loc --      Pain Edu? --      Excl. in GC? --     Constitutional: Alert and oriented x4 appears extremely uncomfortable nontoxic no diaphoresis speaks full clear sentences Eyes: PERRL EOMI. Head: Atraumatic. Nose: No congestion/rhinnorhea. Mouth/Throat: No trismus Neck: No meningismus.  Rash as below Cardiovascular: Tachycardic rate, regular rhythm. Grossly normal heart sounds.  Good peripheral circulation. Respiratory: Somewhat increased respiratory effort.  No retractions. Lungs CTAB and moving good air Gastrointestinal: Soft nontender Musculoskeletal: No lower extremity edema   Neurologic:  Normal speech and language. No gross focal neurologic deficits are appreciated. Skin: Indurated mass to posterior aspect of the neck with overlying erythema and cellulitis.  No evidence of necrotizing soft tissue infection Psychiatric: Mood and affect are normal. Speech and behavior are normal.    ____________________________________________   DIFFERENTIAL includes but not limited to  Cellulitis, abscess, sepsis ____________________________________________   LABS (all labs ordered are listed, but only abnormal results are displayed)  Labs Reviewed  COMPREHENSIVE METABOLIC PANEL - Abnormal; Notable for the following components:      Result Value   Glucose, Bld 157 (*)    All other components within normal limits  CBC WITH DIFFERENTIAL/PLATELET - Abnormal; Notable for the following components:   WBC 16.6 (*)    Neutro Abs 13.7 (*)    Monocytes Absolute 1.1 (*)    All other components within normal limits  URINALYSIS, COMPLETE (UACMP) WITH MICROSCOPIC - Abnormal; Notable for the following components:   Color, Urine AMBER (*)    APPearance CLEAR (*)    Protein, ur 30 (*)    All other components within normal limits  LACTIC ACID, PLASMA - Abnormal;  Notable for the following components:   Lactic Acid, Venous 2.9 (*)    All other components within normal limits  URINE DRUG SCREEN, QUALITATIVE ([facility] ONLY) - Abnormal; Notable for the following components:   Cocaine Metabolite,Ur [place] POSITIVE (*)    Opiate, Ur Screen POSITIVE (*)    Cannabinoid 50 Ng, Ur [place] POSITIVE (*)    Benzodiazepine, Ur Scrn POSITIVE (*)    All other components within normal limits  CULTURE, BLOOD (ROUTINE X 2)  CULTURE, BLOOD (ROUTINE X 2)  AEROBIC/ANAEROBIC CULTURE (SURGICAL/DEEP WOUND)  LACTIC ACID, PLASMA  HIV ANTIBODY (ROUTINE TESTING)  CBC  VANCOMYCIN, TROUGH    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  Elevated lactic acid is concerning for under resuscitation and possible sepsis __________________________________________  EKG   ____________________________________________  RADIOLOGY  CT scan of the neck with IV contrast reviewed by me is consistent with phlegmon and no frank abscess ____________________________________________   PROCEDURES  Procedure(s) performed: no  .Critical Care Performed by: Merrily Brittle, MD Authorized by: Merrily Brittle,  MD   Critical care provider statement:    Critical care time (minutes):  30   Critical care time was exclusive of:  Separately billable procedures and treating other patients   Critical care was necessary to treat or prevent imminent or life-threatening deterioration of the following conditions:  Sepsis   Critical care was time spent personally by me on the following activities:  Development of treatment plan with patient or surrogate, discussions with consultants, evaluation of patient's response to treatment, examination of patient, obtaining history from patient or surrogate, ordering and performing treatments and interventions, ordering and review of laboratory studies, ordering and review of radiographic studies, pulse oximetry, re-evaluation of patient's  condition and review of old charts    Critical Care performed: yes  Observation: no ____________________________________________   INITIAL IMPRESSION / ASSESSMENT AND PLAN / ED COURSE  Pertinent labs & imaging results that were available during my care of the patient were reviewed by me and considered in my medical decision making (see chart for details).  Patient arrives tachycardic and quite uncomfortable appearing with obvious cellulitis to the posterior neck with progression from previous and possible abscess.  IV fentanyl given for the patient's significant pain and CT scan are pending.  IV antibiotics in the meantime.  He had been taking only clindamycin before so I will broaden his coverage to vancomycin as well as Ancef.  The patient CT is negative for obvious abscess and is suggestive of phlegmon.  His lactic acid is elevated at 2.9.  At this point the patient is failed outpatient antibiotics and requires inpatient admission for further IV fluids and IV antibiotics.  Discussed the patient verbalized understanding and agreement with the plan.  I then discussed with the hospitalist who has graciously agreed to admit the patient to his service.      ____________________________________________   FINAL CLINICAL IMPRESSION(S) / ED DIAGNOSES  Final diagnoses:  Cellulitis of head (any part, except face)  Sepsis, due to unspecified organism (HCC)  Lactic acidosis      NEW MEDICATIONS STARTED DURING THIS VISIT:  Discharge Medication List as of 01/12/2018  6:03 PM    START taking these medications   Details  amoxicillin-clavulanate (AUGMENTIN) 875-125 MG tablet Take 1 tablet by mouth 2 (two) times daily for 10 days., Starting Tue 01/12/2018, Until Fri 01/22/2018, Print         Note:  This document was prepared using Dragon voice recognition software and may include unintentional dictation errors.     Merrily Brittle, MD 01/13/18 2250    Merrily Brittle, MD 01/20/18  2200

## 2018-01-10 NOTE — ED Notes (Signed)
This RN called patient's cell phone x's 3, advised pt to return to the ED.

## 2018-01-10 NOTE — Progress Notes (Signed)
Pharmacy Antibiotic Note  Spencer PughRobert Schultz is a 27 y.o. male admitted on 01/10/2018 with cellulitis.  Pharmacy has been consulted for Unasyn and Vanco dosing.  Plan: Unasyn 3g IV q8h   Vancomycin 1g IV once followed by 6 hour stack dose of vancomycin 1250 mg IV q12h. Estimated Cmin 17. Will check first VT before fourth dose. VT goal 15-20.   ke 0.09, t1/2 7.9, Vd 49.5  Height: 5\' 9"  (175.3 cm) Weight: 200 lb (90.7 kg) IBW/kg (Calculated) : 70.7  Temp (24hrs), Avg:98.5 F (36.9 C), Min:98.5 F (36.9 C), Max:98.5 F (36.9 C)  Recent Labs  Lab 01/08/18 0904 01/10/18 1118 01/10/18 1524  WBC 14.3* 16.6*  --   CREATININE 0.95 1.10  --   LATICACIDVEN  --   --  2.9*    Estimated Creatinine Clearance: 112.3 mL/min (by C-G formula based on SCr of 1.1 mg/dL).    No Known Allergies  Antimicrobials this admission: 4/14 Cefazolin >> once 4/14 Unasyn >>  4/14 Vanco >>  Dose adjustments this admission:   Microbiology results: 4/14 BCx: sent   Thank you for allowing pharmacy to be a part of this patient's care.  Cleopatra CedarStephanie Maude Gloor, PharmD Pharmacy Resident  01/10/2018 5:20 PM

## 2018-01-10 NOTE — H&P (Signed)
[facility] - [facility] [place] at [facility]   PATIENT NAME: Spencer Schultz    MR#:  [id number]  DATE OF BIRTH:  Nov 29, 1990  DATE OF ADMISSION:  01/10/2018  PRIMARY CARE PHYSICIAN: Patient, No Pcp Per   REQUESTING/REFERRING PHYSICIAN: Dr. Lamont Snowball  CHIEF COMPLAINT:   Chief Complaint  Patient presents with  . Cellulitis    HISTORY OF PRESENT ILLNESS:  Myreon Wimer  is a 27 y.o. male with a known history of tobacco use presents to the hospital with a second visit due to redness, pain and swelling in the hospital area extending on the left side of the neck.  Patient was seen in the emergency room for similar complaint 2 days back and discharged home on clindamycin and Norco.  This has not improved and pain has worsened.  CT scan of the area shows no abscess.  Small phlegmon.  Patient has no open ulcers or discharge.  Being admitted for failed outpatient treatment of cellulitis.  He had cellulitis of the right leg 1 year back which resolved with oral antibiotics. No history of MRSA.  No history of IV drug use.  PAST MEDICAL HISTORY:  History reviewed. No pertinent past medical history.  PAST SURGICAL HISTORY:   Past Surgical History:  Procedure Laterality Date  . TONSILLECTOMY      SOCIAL HISTORY:   Social History   Tobacco Use  . Smoking status: Current Every Day Smoker    Packs/day: 1.00    Types: Cigarettes  . Smokeless tobacco: Never Used  Substance Use Topics  . Alcohol use: No    FAMILY HISTORY:   Family History  Problem Relation Age of Onset  . Skin cancer Neg Hx     DRUG ALLERGIES:  No Known Allergies  REVIEW OF SYSTEMS:   Review of Systems  Constitutional: Positive for chills, fever and malaise/fatigue. Negative for weight loss.  HENT: Negative for hearing loss and nosebleeds.   Eyes: Negative for blurred vision, double vision and pain.  Respiratory: Negative for cough, hemoptysis, sputum production, shortness of breath and wheezing.    Cardiovascular: Negative for chest pain, palpitations, orthopnea and leg swelling.  Gastrointestinal: Negative for abdominal pain, constipation, diarrhea, nausea and vomiting.  Genitourinary: Negative for dysuria and hematuria.  Musculoskeletal: Negative for back pain, falls and myalgias.  Skin: Negative for rash.  Neurological: Negative for dizziness, tremors, sensory change, speech change, focal weakness, seizures and headaches.  Endo/Heme/Allergies: Does not bruise/bleed easily.  Psychiatric/Behavioral: Negative for depression and memory loss. The patient is not nervous/anxious.     MEDICATIONS AT HOME:   Prior to Admission medications   Medication Sig Start Date End Date Taking? Authorizing Provider  clindamycin (CLEOCIN) 300 MG capsule Take 1 capsule (300 mg total) by mouth 3 (three) times daily for 10 days. 01/08/18 01/18/18 Yes Triplett, Cari B, FNP  cetirizine (ZYRTEC) 10 MG tablet Take 1 tablet (10 mg total) by mouth daily. Patient not taking: Reported on 01/10/2018 04/25/17   Cuthriell, Delorise Royals, PA-C  cyclobenzaprine (FLEXERIL) 10 MG tablet Take 1 tablet (10 mg total) by mouth every 8 (eight) hours as needed for muscle spasms. Patient not taking: Reported on 01/10/2018 11/29/15   Ignacia Bayley, PA-C  fluticasone [facility]) 50 MCG/ACT nasal spray Place 1 spray into both nostrils 2 (two) times daily. Patient not taking: Reported on 01/10/2018 04/25/17   Cuthriell, Delorise Royals, PA-C  HYDROcodone-acetaminophen (NORCO/VICODIN) 5-325 MG tablet Take 1 tablet by mouth every 4 (four) hours as needed for moderate pain. Patient  not taking: Reported on 01/10/2018 01/08/18 01/08/19  Chinita Pesterriplett, Cari B, FNP  ibuprofen (ADVIL,MOTRIN) 800 MG tablet Take 1 tablet (800 mg total) by mouth every 8 (eight) hours as needed. Patient not taking: Reported on 01/10/2018 11/29/15   [name], Nina, PA-C  oxyCODONE-acetaminophen (ROXICET) 5-325 MG tablet Take 1-2 tablets by mouth every 4 (four) hours as needed for severe  pain. Patient not taking: Reported on 01/10/2018 03/01/17   [name], [name], MD     VITAL SIGNS:  Blood pressure 115/77, pulse 77, temperature 98.5 F (36.9 C), temperature source Oral, resp. rate 11, height 5\' 9"  (1.753 m), weight 90.7 kg (200 lb), SpO2 97 %.  PHYSICAL EXAMINATION:  Physical Exam  GENERAL:  27 y.o.-year-old patient lying in the bed with no acute distress.  EYES: Pupils equal, round, reactive to light and accommodation. No scleral icterus. Extraocular muscles intact.  HEENT: Head atraumatic, normocephalic. Oropharynx and nasopharynx clear. No oropharyngeal erythema, moist oral mucosa  NECK:  Supple, no jugular venous distention. No thyroid enlargement, no tenderness.  LUNGS: Normal breath sounds bilaterally, no wheezing, rales, rhonchi. No use of accessory muscles of respiration.  CARDIOVASCULAR: S1, S2 normal. No murmurs, rubs, or gallops.  ABDOMEN: Soft, nontender, nondistended. Bowel sounds present. No organomegaly or mass.  EXTREMITIES: No pedal edema, cyanosis, or clubbing. + 2 pedal & radial pulses b/l.   NEUROLOGIC: Cranial nerves II through XII are intact. No focal Motor or sensory deficits appreciated b/l PSYCHIATRIC: The patient is alert and oriented x 3. Good affect.  SKIN: Redness with induration in the occipital area of 4 x 4 cm.  Redness extending onto left side of the neck.  No open ulcers or discharge  LABORATORY PANEL:   CBC Recent Labs  Lab 01/10/18 1118  WBC 16.6*  HGB 16.8  HCT 49.7  PLT 228   ------------------------------------------------------------------------------------------------------------------  Chemistries  Recent Labs  Lab 01/10/18 1118  NA 137  K 4.6  CL 102  CO2 27  GLUCOSE 157*  BUN 12  CREATININE 1.10  CALCIUM 9.4  AST 24  ALT 17  ALKPHOS 65  BILITOT 0.7   ------------------------------------------------------------------------------------------------------------------  Cardiac Enzymes No results for input(s):  TROPONINI in the last 168 hours. ------------------------------------------------------------------------------------------------------------------  RADIOLOGY:  Ct Soft Tissue Neck W Contrast  Result Date: 01/10/2018 CLINICAL DATA:  27 year old male with progressive cellulitis of the posterior neck since seen and evaluated several days ago. Inflammation now extending laterally on both sides. EXAM: CT NECK WITH CONTRAST TECHNIQUE: Multidetector CT imaging of the neck was performed using the standard protocol following the bolus administration of intravenous contrast. CONTRAST:  75mL OMNIPAQUE IOHEXOL 300 MG/ML  SOLN COMPARISON:  Neck CT 01/08/2018. FINDINGS: Pharynx and larynx: Stable and negative. Bilateral parapharyngeal and retropharyngeal spaces remain normal. Salivary glands: Stable sublingual space. Stable and negative submandibular and parotid glands. Thyroid: Negative. Lymph nodes: Marked area of clinical concern corresponds to the posterior neck at the suboccipital/C1 level. Confluent soft tissue inflammation now resembling phlegmon has progressed since the recent neck CT and now encompasses an area of up to 5 centimeters diameter. The inflammation extends to the superficial muscle layer. There is no fluid collection. There is no significant level 5 lymph node enlargement. No soft tissue gas. The subcutaneous fat stranding tracks laterally and anteriorly more so on the left. There is thickening of the posterior and lateral left platysma. There is mild bilateral level 2 B and IIIb lymph node enlargement. No cystic or necrotic nodes. Other lymph node levels are stable and within  normal limits. Vascular: Major vascular structures in the neck including the left IJ and left EJ remain patent. Limited intracranial: Negative. Visualized orbits: Negative. Mastoids and visualized paranasal sinuses: Remain clear. Skeleton: The skull base remains intact and normal. The upper cervical vertebrae remain normal. No  acute osseous abnormality identified. Upper chest: Visible upper lungs and mediastinum remain normal; small volume residual thymus again noted. The visible axillary lymph nodes are normal. IMPRESSION: 1. Mild progression of left suboccipital cellulitis since 01/08/2018, now resembling a subcutaneous phlegmon. There is no associated abscess, soft tissue gas, or other complicating features. Inflammation extends to the superficial muscle layer, but no regional osseous changes are identified. 2. Mild reactive lymphadenopathy. Electronically Signed   By: Odessa Fleming M.D.   On: 01/10/2018 16:00     IMPRESSION AND PLAN:   *Cellulitis of the occipital area extending onto left side of the neck.  No open ulcers or discharge.  Patient has been on clindamycin for 2 days and has no improvement.  Will admit inpatient and start IV vancomycin and Unasyn.  CT scan shows no abscess.  Consult surgery for I&D if any worsening.  *Lactic acidosis due to acute infection.  Will give normal saline bolus stat.  Repeat lactic acid.  DVT prophylaxis with Lovenox  All the records are reviewed and case discussed with ED provider. Management plans discussed with the patient, family and they are in agreement.  CODE STATUS: FULL CODE  TOTAL TIME TAKING CARE OF THIS PATIENT: 40 minutes.   Orie Fisherman M.D on 01/10/2018 at 5:06 PM  Between 7am to 6pm - Pager - [phone]  After 6pm go to [email or web address] - password [email or web address]  [facility] [place] [facility]  Office  [phone]  CC: Primary care physician; Patient, No Pcp Per  Note: This dictation was prepared with Dragon dictation along with smaller phrase technology. Any transcriptional errors that result from this process are unintentional.

## 2018-01-10 NOTE — ED Triage Notes (Signed)
Pt comes into the ED via POV c/o increased cellulitis to the posterior side of his neck and now wrapping around laterally on both sides.  Patient was seen here a couple of days ago and he states it has progressed.  No other complaints at this time, denies any fevers at home, but he has had increased pain.  Patient in NAD at this time with even and unlabored respirations.

## 2018-01-10 NOTE — ED Notes (Signed)
Patient transported to CT 

## 2018-01-11 LAB — CBC
HEMATOCRIT: 42.9 % (ref 40.0–52.0)
Hemoglobin: 14.7 g/dL (ref 13.0–18.0)
MCH: 31.9 pg (ref 26.0–34.0)
MCHC: 34.3 g/dL (ref 32.0–36.0)
MCV: 93 fL (ref 80.0–100.0)
Platelets: 200 10*3/uL (ref 150–440)
RBC: 4.61 MIL/uL (ref 4.40–5.90)
RDW: 14.3 % (ref 11.5–14.5)
WBC: 9.6 10*3/uL (ref 3.8–10.6)

## 2018-01-11 LAB — VANCOMYCIN, TROUGH: VANCOMYCIN TR: 16 ug/mL (ref 15–20)

## 2018-01-11 MED ORDER — AMPICILLIN-SULBACTAM SODIUM 3 (2-1) G IJ SOLR
3.0000 g | Freq: Four times a day (QID) | INTRAMUSCULAR | Status: DC
  Administered 2018-01-11 – 2018-01-12 (×5): 3 g via INTRAVENOUS
  Filled 2018-01-11 (×9): qty 3

## 2018-01-11 MED ORDER — OXYCODONE-ACETAMINOPHEN 5-325 MG PO TABS
1.0000 | ORAL_TABLET | Freq: Four times a day (QID) | ORAL | Status: DC | PRN
  Administered 2018-01-12: 1 via ORAL

## 2018-01-11 MED ORDER — TRAMADOL HCL 50 MG PO TABS
50.0000 mg | ORAL_TABLET | Freq: Four times a day (QID) | ORAL | Status: DC | PRN
  Administered 2018-01-11: 50 mg via ORAL
  Filled 2018-01-11: qty 1

## 2018-01-11 MED ORDER — SODIUM CHLORIDE 0.9 % IV SOLN
INTRAVENOUS | Status: DC | PRN
  Administered 2018-01-12 (×3): via INTRAVENOUS

## 2018-01-11 MED ORDER — CYCLOBENZAPRINE HCL 10 MG PO TABS
10.0000 mg | ORAL_TABLET | Freq: Three times a day (TID) | ORAL | Status: DC | PRN
  Administered 2018-01-11 – 2018-01-12 (×3): 10 mg via ORAL
  Filled 2018-01-11 (×3): qty 1

## 2018-01-11 MED ORDER — NICOTINE 14 MG/24HR TD PT24
14.0000 mg | MEDICATED_PATCH | Freq: Every day | TRANSDERMAL | Status: DC
  Administered 2018-01-11: 14 mg via TRANSDERMAL
  Filled 2018-01-11: qty 1

## 2018-01-11 MED ORDER — OXYCODONE-ACETAMINOPHEN 5-325 MG PO TABS
2.0000 | ORAL_TABLET | Freq: Four times a day (QID) | ORAL | Status: DC | PRN
  Administered 2018-01-11 – 2018-01-12 (×3): 2 via ORAL
  Filled 2018-01-11 (×4): qty 2

## 2018-01-11 NOTE — Progress Notes (Signed)
[facility] - [place] at [facility]   PATIENT NAME: Spencer Schultz    MR#:  [id number]  DATE OF BIRTH:  1990/12/30  SUBJECTIVE:  CHIEF COMPLAINT:   Chief Complaint  Patient presents with  . Cellulitis  Patient complains of pain from affected neck area, consult surgery for infected upper neck mass  REVIEW OF SYSTEMS:  CONSTITUTIONAL: No fever, fatigue or weakness.  EYES: No blurred or double vision.  EARS, NOSE, AND THROAT: No tinnitus or ear pain.  RESPIRATORY: No cough, shortness of breath, wheezing or hemoptysis.  CARDIOVASCULAR: No chest pain, orthopnea, edema.  GASTROINTESTINAL: No nausea, vomiting, diarrhea or abdominal pain.  GENITOURINARY: No dysuria, hematuria.  ENDOCRINE: No polyuria, nocturia,  HEMATOLOGY: No anemia, easy bruising or bleeding SKIN: No rash or lesion. MUSCULOSKELETAL: No joint pain or arthritis.   NEUROLOGIC: No tingling, numbness, weakness.  PSYCHIATRY: No anxiety or depression.   ROS  DRUG ALLERGIES:  No Known Allergies  VITALS:  Blood pressure 128/85, pulse (!) 51, temperature 97.7 F (36.5 C), temperature source Oral, resp. rate 20, height 5\' 9"  (1.753 m), weight 90.7 kg (200 lb), SpO2 100 %.  PHYSICAL EXAMINATION:  GENERAL:  27 y.o.-year-old patient lying in the bed with no acute distress.  EYES: Pupils equal, round, reactive to light and accommodation. No scleral icterus. Extraocular muscles intact.  HEENT: Head atraumatic, normocephalic. Oropharynx and nasopharynx clear.  NECK:  Supple, no jugular venous distention. No thyroid enlargement, no tenderness.  LUNGS: Normal breath sounds bilaterally, no wheezing, rales,rhonchi or crepitation. No use of accessory muscles of respiration.  CARDIOVASCULAR: S1, S2 normal. No murmurs, rubs, or gallops.  ABDOMEN: Soft, nontender, nondistended. Bowel sounds present. No organomegaly or mass.  EXTREMITIES: No pedal edema, cyanosis, or clubbing.  NEUROLOGIC: Cranial nerves II through XII  are intact. Muscle strength 5/5 in all extremities. Sensation intact. Gait not checked.  PSYCHIATRIC: The patient is alert and oriented x 3.  SKIN: No obvious rash, or ulcer.  Infected upper neck mass  Physical Exam LABORATORY PANEL:   CBC Recent Labs  Lab 01/11/18 0518  WBC 9.6  HGB 14.7  HCT 42.9  PLT 200   ------------------------------------------------------------------------------------------------------------------  Chemistries  Recent Labs  Lab 01/10/18 1118  NA 137  K 4.6  CL 102  CO2 27  GLUCOSE 157*  BUN 12  CREATININE 1.10  CALCIUM 9.4  AST 24  ALT 17  ALKPHOS 65  BILITOT 0.7   ------------------------------------------------------------------------------------------------------------------  Cardiac Enzymes No results for input(s): TROPONINI in the last 168 hours. ------------------------------------------------------------------------------------------------------------------  RADIOLOGY:  Ct Soft Tissue Neck W Contrast  Result Date: 01/10/2018 CLINICAL DATA:  27 year old male with progressive cellulitis of the posterior neck since seen and evaluated several days ago. Inflammation now extending laterally on both sides. EXAM: CT NECK WITH CONTRAST TECHNIQUE: Multidetector CT imaging of the neck was performed using the standard protocol following the bolus administration of intravenous contrast. CONTRAST:  75mL OMNIPAQUE IOHEXOL 300 MG/ML  SOLN COMPARISON:  Neck CT 01/08/2018. FINDINGS: Pharynx and larynx: Stable and negative. Bilateral parapharyngeal and retropharyngeal spaces remain normal. Salivary glands: Stable sublingual space. Stable and negative submandibular and parotid glands. Thyroid: Negative. Lymph nodes: Marked area of clinical concern corresponds to the posterior neck at the suboccipital/C1 level. Confluent soft tissue inflammation now resembling phlegmon has progressed since the recent neck CT and now encompasses an area of up to 5 centimeters  diameter. The inflammation extends to the superficial muscle layer. There is no fluid collection. There is no significant level  5 lymph node enlargement. No soft tissue gas. The subcutaneous fat stranding tracks laterally and anteriorly more so on the left. There is thickening of the posterior and lateral left platysma. There is mild bilateral level 2 B and IIIb lymph node enlargement. No cystic or necrotic nodes. Other lymph node levels are stable and within normal limits. Vascular: Major vascular structures in the neck including the left IJ and left EJ remain patent. Limited intracranial: Negative. Visualized orbits: Negative. Mastoids and visualized paranasal sinuses: Remain clear. Skeleton: The skull base remains intact and normal. The upper cervical vertebrae remain normal. No acute osseous abnormality identified. Upper chest: Visible upper lungs and mediastinum remain normal; small volume residual thymus again noted. The visible axillary lymph nodes are normal. IMPRESSION: 1. Mild progression of left suboccipital cellulitis since 01/08/2018, now resembling a subcutaneous phlegmon. There is no associated abscess, soft tissue gas, or other complicating features. Inflammation extends to the superficial muscle layer, but no regional osseous changes are identified. 2. Mild reactive lymphadenopathy. Electronically Signed   By: Odessa FlemingH  Hall M.D.   On: 01/10/2018 16:00    ASSESSMENT AND PLAN:  *Acute infected upper neck mass  Noted failed outpatient treatment with clindamycin Continue empiric vancomycin/Unasyn, follow-up on cultures, general surgery to see for possible need for I&D   *Lactic acidosis, acute Resolved with IV fluids Secondary to above  *Acute on chronic polysubstance abuse Urine drug screen noted for benzos/cocaine/marijuana/opioids Cessation counseling given  *Acute pain syndrome  Avoid opioid medications    All the records are reviewed and case discussed with Care Management/Social  Workerr. Management plans discussed with the patient, family and they are in agreement.  CODE STATUS: full  TOTAL TIME TAKING CARE OF THIS PATIENT: 35 minutes.     POSSIBLE D/C IN 1-3 DAYS, DEPENDING ON CLINICAL CONDITION.   Evelena AsaMontell D Kaysee Hergert M.D on 01/11/2018   Between 7am to 6pm - Pager - [phone]  After 6pm go to [email or web address] - password [email or web address]  [facility] [place] [facility]  Office  [phone]  CC: Primary care physician; Patient, No Pcp Per  Note: This dictation was prepared with Dragon dictation along with smaller phrase technology. Any transcriptional errors that result from this process are unintentional.

## 2018-01-12 DIAGNOSIS — L02811 Cutaneous abscess of head [any part, except face]: Secondary | ICD-10-CM

## 2018-01-12 LAB — HIV ANTIBODY (ROUTINE TESTING W REFLEX): HIV Screen 4th Generation wRfx: NONREACTIVE

## 2018-01-12 MED ORDER — AMOXICILLIN-POT CLAVULANATE 875-125 MG PO TABS: 1.0000 | ORAL_TABLET | Freq: Two times a day (BID) | ORAL | 0 refills | Status: AC

## 2018-01-12 MED ORDER — LIDOCAINE HCL 2 % IJ SOLN
10.0000 mL | Freq: Once | INTRAMUSCULAR | Status: AC
  Administered 2018-01-12: 200 mg via INTRADERMAL
  Filled 2018-01-12: qty 10

## 2018-01-12 NOTE — Progress Notes (Signed)
[employer] - [place] at [facility]   PATIENT NAME: Spencer Schultz    MR#:  [id number]  DATE OF BIRTH:  01-08-1991  SUBJECTIVE:  CHIEF COMPLAINT:   Chief Complaint  Patient presents with  . Cellulitis  Patient feeling better, no complaints overnight, pain is well controlled  REVIEW OF SYSTEMS:  CONSTITUTIONAL: No fever, fatigue or weakness.  EYES: No blurred or double vision.  EARS, NOSE, AND THROAT: No tinnitus or ear pain.  RESPIRATORY: No cough, shortness of breath, wheezing or hemoptysis.  CARDIOVASCULAR: No chest pain, orthopnea, edema.  GASTROINTESTINAL: No nausea, vomiting, diarrhea or abdominal pain.  GENITOURINARY: No dysuria, hematuria.  ENDOCRINE: No polyuria, nocturia,  HEMATOLOGY: No anemia, easy bruising or bleeding SKIN: No rash or lesion. MUSCULOSKELETAL: No joint pain or arthritis.   NEUROLOGIC: No tingling, numbness, weakness.  PSYCHIATRY: No anxiety or depression.   ROS  DRUG ALLERGIES:  No Known Allergies  VITALS:  Blood pressure 120/72, pulse 66, temperature 98 F (36.7 C), temperature source Oral, resp. rate 20, height 5\' 9"  (1.753 m), weight 95.3 kg (210 lb 3.2 oz), SpO2 99 %.  PHYSICAL EXAMINATION:  GENERAL:  27 y.o.-year-old patient lying in the bed with no acute distress.  EYES: Pupils equal, round, reactive to light and accommodation. No scleral icterus. Extraocular muscles intact.  HEENT: Head atraumatic, normocephalic. Oropharynx and nasopharynx clear.  NECK:  Supple, no jugular venous distention. No thyroid enlargement, no tenderness.  LUNGS: Normal breath sounds bilaterally, no wheezing, rales,rhonchi or crepitation. No use of accessory muscles of respiration.  CARDIOVASCULAR: S1, S2 normal. No murmurs, rubs, or gallops.  ABDOMEN: Soft, nontender, nondistended. Bowel sounds present. No organomegaly or mass.  EXTREMITIES: No pedal edema, cyanosis, or clubbing.  NEUROLOGIC: Cranial nerves II through XII are intact. Muscle  strength 5/5 in all extremities. Sensation intact. Gait not checked.  PSYCHIATRIC: The patient is alert and oriented x 3.  SKIN: No obvious rash, lesion, or ulcer.   Physical Exam LABORATORY PANEL:   CBC Recent Labs  Lab 01/11/18 0518  WBC 9.6  HGB 14.7  HCT 42.9  PLT 200   ------------------------------------------------------------------------------------------------------------------  Chemistries  Recent Labs  Lab 01/10/18 1118  NA 137  K 4.6  CL 102  CO2 27  GLUCOSE 157*  BUN 12  CREATININE 1.10  CALCIUM 9.4  AST 24  ALT 17  ALKPHOS 65  BILITOT 0.7   ------------------------------------------------------------------------------------------------------------------  Cardiac Enzymes No results for input(s): TROPONINI in the last 168 hours. ------------------------------------------------------------------------------------------------------------------  RADIOLOGY:  Ct Soft Tissue Neck W Contrast  Result Date: 01/10/2018 CLINICAL DATA:  27 year old male with progressive cellulitis of the posterior neck since seen and evaluated several days ago. Inflammation now extending laterally on both sides. EXAM: CT NECK WITH CONTRAST TECHNIQUE: Multidetector CT imaging of the neck was performed using the standard protocol following the bolus administration of intravenous contrast. CONTRAST:  75mL OMNIPAQUE IOHEXOL 300 MG/ML  SOLN COMPARISON:  Neck CT 01/08/2018. FINDINGS: Pharynx and larynx: Stable and negative. Bilateral parapharyngeal and retropharyngeal spaces remain normal. Salivary glands: Stable sublingual space. Stable and negative submandibular and parotid glands. Thyroid: Negative. Lymph nodes: Marked area of clinical concern corresponds to the posterior neck at the suboccipital/C1 level. Confluent soft tissue inflammation now resembling phlegmon has progressed since the recent neck CT and now encompasses an area of up to 5 centimeters diameter. The inflammation extends to  the superficial muscle layer. There is no fluid collection. There is no significant level 5 lymph node enlargement. No soft tissue  gas. The subcutaneous fat stranding tracks laterally and anteriorly more so on the left. There is thickening of the posterior and lateral left platysma. There is mild bilateral level 2 B and IIIb lymph node enlargement. No cystic or necrotic nodes. Other lymph node levels are stable and within normal limits. Vascular: Major vascular structures in the neck including the left IJ and left EJ remain patent. Limited intracranial: Negative. Visualized orbits: Negative. Mastoids and visualized paranasal sinuses: Remain clear. Skeleton: The skull base remains intact and normal. The upper cervical vertebrae remain normal. No acute osseous abnormality identified. Upper chest: Visible upper lungs and mediastinum remain normal; small volume residual thymus again noted. The visible axillary lymph nodes are normal. IMPRESSION: 1. Mild progression of left suboccipital cellulitis since 01/08/2018, now resembling a subcutaneous phlegmon. There is no associated abscess, soft tissue gas, or other complicating features. Inflammation extends to the superficial muscle layer, but no regional osseous changes are identified. 2. Mild reactive lymphadenopathy. Electronically Signed   By: Odessa FlemingH  Hall M.D.   On: 01/10/2018 16:00    ASSESSMENT AND PLAN:  *Acute infected upper neck mass/abscess Noted failed outpatient treatment with clindamycin Continue empiric vancomycin/Unasyn, follow-up on cultures, general surgery to see for possible need for I&D   *Lactic acidosis, acute Resolved with IV fluids Secondary to above  *Acute on chronic polysubstance abuse Urine drug screen noted for benzos/cocaine/marijuana/opioids Cessation counseling given  *Acute pain syndrome  Controlled on current regiment  Disposition home in the next 1-2 days   All the records are reviewed and case discussed with Care  Management/Social Workerr. Management plans discussed with the patient, family and they are in agreement.  CODE STATUS: full  TOTAL TIME TAKING CARE OF THIS PATIENT: 35 minutes.     POSSIBLE D/C IN 1-2 DAYS, DEPENDING ON CLINICAL CONDITION.   Evelena AsaMontell D Salary M.D on 01/12/2018   Between 7am to 6pm - Pager - [phone]  After 6pm go to [email or web address] - password [email or web address]  [facility] [place] [facility]  Office  [phone]  CC: Primary care physician; Patient, No Pcp Per  Note: This dictation was prepared with Dragon dictation along with smaller phrase technology. Any transcriptional errors that result from this process are unintentional.

## 2018-01-12 NOTE — Progress Notes (Signed)
Pharmacy Antibiotic Note  Spencer PughRobert Schultz is a 27 y.o. male admitted on 01/10/2018 with cellulitis.  Pharmacy has been consulted for Unasyn and Vanco dosing.  Plan: Unasyn 3g IV q8h   Vancomycin 1g IV once followed by 6 hour stack dose of vancomycin 1250 mg IV q12h. Estimated Cmin 17. Will check first VT before fourth dose. VT goal 15-20.   ke 0.09, t1/2 7.9, Vd 49.5  Height: 5\' 9"  (175.3 cm) Weight: 200 lb (90.7 kg) IBW/kg (Calculated) : 70.7  Temp (24hrs), Avg:98 F (36.7 C), Min:97.7 F (36.5 C), Max:98.3 F (36.8 C)  Recent Labs  Lab 01/08/18 0904 01/10/18 1118 01/10/18 1524 01/10/18 1918 01/11/18 0518 01/11/18 2230  WBC 14.3* 16.6*  --   --  9.6  --   CREATININE 0.95 1.10  --   --   --   --   LATICACIDVEN  --   --  2.9* 1.5  --   --   VANCOTROUGH  --   --   --   --   --  16    Estimated Creatinine Clearance: 112.3 mL/min (by C-G formula based on SCr of 1.1 mg/dL).    No Known Allergies  Antimicrobials this admission: 4/14 Cefazolin >> once 4/14 Unasyn >>  4/14 Vanco >>  Dose adjustments/monitoring this admission: 4/15 PM vanc level 16. Continue current regimen.  Microbiology results: 4/14 BCx: sent   Thank you for allowing pharmacy to be a part of this patient's care.  Erich MontaneMcBane,Alfard Cochrane S, PharmD Pharmacy Resident  01/12/2018 12:46 AM

## 2018-01-12 NOTE — Progress Notes (Signed)
Attempted to see patient for consultation, but patient is reportedly outside per RN, will return when patient returns.  -- Scherrie GerlachJason E. Earlene Plateravis, MD, RPVI [place]: [facility] - Partnering for exceptional care. Office: [phone]

## 2018-01-12 NOTE — Procedures (Signed)
SURGICAL OPERATIVE REPORT  DATE OF PROCEDURE: 01/12/2018  ATTENDING: Barbara CowerJason E. Earlene Plateravis, MD  ANESTHESIA: Local  PRE-OPERATIVE DIAGNOSIS: Posterior scalp/neck abscess (icd-10's: L02.811)  POST-OPERATIVE DIAGNOSIS: Posterior scalp/neck abscess (icd-10's: L02.811)  PROCEDURE(S):  1.) Incision and drainage of occipital scalp abscess (cpt: 10060)  INTRAOPERATIVE FINDINGS: ~25 mL of foul-smelling brownish purulent fluid under pressure from occipital scalp abscess  INTRAVENOUS FLUIDS: 0 mL crystalloid   ESTIMATED BLOOD LOSS: Minimal (<20 mL)   URINE OUTPUT: No Foley catheter   SPECIMENS: Contents of occipital scalp abscess for culture  IMPLANTS: None  DRAINS: None (gauze packing)  COMPLICATIONS: None apparent  CONDITION AT END OF PROCEDURE: Hemodynamically stable and awake  DISPOSITION OF PATIENT: Med-surg bed  INDICATIONS FOR PROCEDURE:  27 y.o. male presented to [facility] ED and admitted to medical service 2 days ago for posterior neck pain, redness, and swelling, for which he similarly presented to ED 2 days prior and was discharged home with oral antibiotics (clindamycin) and narcotic pain medication (Norco). Patient reports he returned to the ED after his symptoms did not improve with worsening of his pain in particular. CT of patient's neck did not at that time demonstrate any abscess, and patient was accordingly admitted for IV antibiotic treatment of cellulitis with induration and phlegmon. Since admission, however, while the redness and pain both improved, fluctuance at the center of the affected region developed, suggestive of abscess. All risks, benefits, and alternatives to incision and drainage of occipital scalp abscess were discussed with the patient, all of patient's questions were answered to his expressed satisfaction, and informed consent was obtained and documented.  DETAILS OF PROCEDURE: Remaining in patient's  med-surg bed, patient was appropriately identified. In seated position, operative site was prepped and draped in the usual sterile fashion, and following a brief time out, the focus of maximal fluctuance was identified and a 2 cm incision was made using a #11 blade scalpel, upon which ~25 mL of foul-smelling brownish purulent fluid was immediately released under some pressure and deep abscess culture was obtained. Additional fluid was then expressed, and intra-abscess septations/loculations were then disrupted using a hemostat.   The abscess cavity was then irrigated and suctioned. A dry gauze was inserted and removed, after which sterile gauze was advanced into the former abscess cavity to promote drainage and prevent closure of the skin over the drained abscess cavity. Surrounding skin was then cleaned and dried, and a sterile dry gauze and adhesive dressing was applied.  I was present for all aspects of the above procedure, and no operative complications were apparent.

## 2018-01-12 NOTE — Consult Note (Signed)
SURGICAL CONSULTATION NOTE (initial) - cpt: [phone]  HISTORY OF PRESENT ILLNESS (HPI):  27 y.o. Schultz presented to [facility] ED and admitted to medical service 2 days ago for posterior neck pain, redness, and swelling, for which he similarly presented to ED 2 days prior and was discharged home with oral antibiotics (clindamycin) and narcotic pain medication (Norco). Patient reports he returned to the ED after his symptoms did not improve with worsening of his pain in particular. CT of patient's neck did not at that time demonstrate any abscess, and patient was accordingly admitted for IV antibiotic treatment of cellulitis with induration and phlegmon. Since admission, patient describes his neck/scalp has become more "squishy" whereas it was previously more "hard". He also states the redness and pain have both improved without fever/chills, drainage, N/V, CP, or SOB.  Surgery is consulted by medical physician Dr. Katheren Shams in this context for evaluation and management of infected neck mass, possible abscess.  PAST MEDICAL HISTORY (PMH):  History reviewed. No pertinent past medical history.   PAST SURGICAL HISTORY (PSH):  Past Surgical History:  Procedure Laterality Date  . TONSILLECTOMY       MEDICATIONS:  Prior to Admission medications   Medication Sig Start Date End Date Taking? Authorizing Provider  clindamycin (CLEOCIN) 300 MG capsule Take 1 capsule (300 mg total) by mouth 3 (three) times daily for 10 days. 01/08/18 01/18/18 Yes Triplett, Cari B, FNP  cetirizine (ZYRTEC) 10 MG tablet Take 1 tablet (10 mg total) by mouth daily. Patient not taking: Reported on 01/10/2018 04/25/17   Cuthriell, Delorise Royals, PA-C  cyclobenzaprine (FLEXERIL) 10 MG tablet Take 1 tablet (10 mg total) by mouth every 8 (eight) hours as needed for muscle spasms. Patient not taking: Reported on 01/10/2018 11/29/15   Ignacia Bayley, PA-C  fluticasone [facility]) 50 MCG/ACT nasal spray Place 1 spray into both nostrils 2 (two) times  daily. Patient not taking: Reported on 01/10/2018 04/25/17   Cuthriell, Delorise Royals, PA-C  HYDROcodone-acetaminophen (NORCO/VICODIN) 5-325 MG tablet Take 1 tablet by mouth every 4 (four) hours as needed for moderate pain. Patient not taking: Reported on 01/10/2018 01/08/18 01/08/19  Chinita Pester, FNP  ibuprofen (ADVIL,MOTRIN) 800 MG tablet Take 1 tablet (800 mg total) by mouth every 8 (eight) hours as needed. Patient not taking: Reported on 01/10/2018 11/29/15   Ignacia Bayley, PA-C  oxyCODONE-acetaminophen (ROXICET) 5-325 MG tablet Take 1-2 tablets by mouth every 4 (four) hours as needed for severe pain. Patient not taking: Reported on 01/10/2018 03/01/17   Loleta Rose, MD     ALLERGIES:  No Known Allergies   SOCIAL HISTORY:  Social History   Socioeconomic History  . Marital status: Single    Spouse name: Not on file  . Number of children: Not on file  . Years of education: Not on file  . Highest education level: Not on file  Occupational History  . Not on file  Social Needs  . Financial resource strain: Not on file  . Food insecurity:    Worry: Not on file    Inability: Not on file  . Transportation needs:    Medical: Not on file    Non-medical: Not on file  Tobacco Use  . Smoking status: Current Every Day Smoker    Packs/day: 1.00    Types: Cigarettes  . Smokeless tobacco: Never Used  Substance and Sexual Activity  . Alcohol use: No  . Drug use: Yes    Types: Marijuana  . Sexual activity: Not on file  Lifestyle  . Physical activity:    Days per week: Not on file    Minutes per session: Not on file  . Stress: Not on file  Relationships  . Social connections:    Talks on phone: Not on file    Gets together: Not on file    Attends religious service: Not on file    Active member of club or organization: Not on file    Attends meetings of clubs or organizations: Not on file    Relationship status: Not on file  . Intimate partner violence:    Fear of current or ex  partner: Not on file    Emotionally abused: Not on file    Physically abused: Not on file    Forced sexual activity: Not on file  Other Topics Concern  . Not on file  Social History Narrative  . Not on file    The patient currently resides (home / rehab facility / nursing home): Home The patient normally is (ambulatory / bedbound): Ambulatory   FAMILY HISTORY:  Family History  Problem Relation Age of Onset  . Skin cancer Neg Hx      REVIEW OF SYSTEMS:  Constitutional: denies weight loss, fever, chills, or sweats  Eyes: denies any other vision changes, history of eye injury  ENT: denies sore throat, hearing problems  Respiratory: denies shortness of breath, wheezing  Cardiovascular: denies chest pain, palpitations  Gastrointestinal: denies abdominal pain, N/V, or diarrhea Genitourinary: denies burning with urination or urinary frequency Musculoskeletal: denies any other joint pains or cramps  Skin: denies any other rashes or skin discolorations except as per HPI Neurological: denies any other headache, dizziness, weakness  Psychiatric: denies any other depression, anxiety   All other review of systems were negative   VITAL SIGNS:  Temp:  [98 F (36.7 C)-98.3 F (36.8 C)] 98 F (36.7 C) (04/16 0422) Pulse Rate:  [50-63] 50 (04/16 0422) Resp:  [19] 19 (04/15 2146) BP: (116-138)/(62-88) 116/62 (04/16 0422) SpO2:  [100 %] 100 % (04/16 0422) Weight:  [210 lb 3.2 oz (95.3 kg)] 210 lb 3.2 oz (95.3 kg) (04/16 0414)     Height: 5\' 9"  (175.3 cm) Weight: 210 lb 3.2 oz (95.3 kg) BMI (Calculated): 31.03   INTAKE/OUTPUT:  This shift: Total I/O In: 240 [P.O.:240] Out: 300 [Urine:300]  Last 2 shifts: @IOLAST2SHIFTS @   PHYSICAL EXAM:  Constitutional:  -- Normal body habitus, smells of cigarette smoke (having just returned from going for "some fresh air" outside) -- Awake, alert, and oriented x3, no apparent distress Eyes:  -- Pupils equally round and reactive to light  -- No  scleral icterus, B/L no occular discharge Ear, nose, throat: -- Neck is FROM WNL -- No jugular venous distension  Pulmonary:  -- No wheezes or rhales -- Equal breath sounds bilaterally -- Breathing non-labored at rest Cardiovascular:  -- S1, S2 present  -- No pericardial rubs  Gastrointestinal:  -- Abdomen soft, nontender, non-distended, no guarding or rebound tenderness -- No abdominal masses appreciated, pulsatile or otherwise  Musculoskeletal and Integumentary:  -- Wounds or skin discoloration: moderately tender to palpation posterior occipital scalp fluctuance with more inferior/neck induration without obvious residual erythema, small ulceration with eschar without drainage -- Extremities: B/L UE and LE FROM, hands and feet warm, no edema  Neurologic:  -- Motor function: Intact and symmetric -- Sensation: Intact and symmetric Psychiatric:  -- Mood and affect WNL  Labs:  CBC Latest Ref Rng & Units 01/11/2018 01/10/2018 01/08/2018  WBC 3.8 - 10.6 K/uL 9.6 16.6(H) 14.3(H)  Hemoglobin 13.0 - 18.0 g/dL 40.9 81.1 91.4  Hematocrit 40.0 - 52.0 % 42.9 49.7 44.8  Platelets 150 - 440 K/uL 200 228 219   CMP Latest Ref Rng & Units 01/10/2018 01/08/2018 03/01/2017  Glucose 65 - 99 mg/dL 782(N) 562(Z) 95  BUN 6 - 20 mg/dL 12 19 16   Creatinine 0.61 - 1.24 mg/dL 3.08 6.57 8.46(N)  Sodium 135 - 145 mmol/L 137 137 139  Potassium 3.5 - 5.1 mmol/L 4.6 3.5 4.0  Chloride 101 - 111 mmol/L 102 107 108  CO2 22 - 32 mmol/L 27 23 24   Calcium 8.9 - 10.3 mg/dL 9.4 9.0 6.2(X)  Total Protein 6.5 - 8.1 g/dL 7.7 - 7.2  Total Bilirubin 0.3 - 1.2 mg/dL 0.7 - 0.5  Alkaline Phos 38 - 126 U/L 65 - 52  AST 15 - 41 U/L 24 - 19  ALT 17 - 63 U/L 17 - 20   Imaging studies:  CT Head and Neck with Contrast (01/10/2018) - personally reviewed and discussed with patient 1. Mild progression of left suboccipital cellulitis since 01/08/2018, now resembling a subcutaneous phlegmon. There is no associated abscess, soft  tissue gas, or other complicating features. Inflammation extends to the superficial muscle layer, but no regional osseous changes are identified. 2. Mild reactive lymphadenopathy.  Assessment/Plan: (ICD-10's: L56.811) 27 y.o. Schultz with occipital scalp abscess (not visualized on recent CT imaging) with more inferior posterior scalp/neck induration, complicated by comorbidities including chronic ongoing tobacco abuse (smoking) and polysubstance abuse with urine toxicology screen positive for benzodiazepines, cocaine, marijuana, and opiates.   - pain control as needed (minimize narcotics as able)  - antibiotics and medical management as per primary medical team  - all risks, benefits, and alternatives to incision and drainage of occipital scalp abscess were discussed with the patient, all of his questions were answered to his expressed satisfaction, patient expresses he wishes to proceed, and informed consent was accordingly obtained, documented, and witnessed at that time.  - will proceed with bedside incision and drainage of occipital scalp/posterior neck abscess  - follow-up cultures to be obtained from incision and drainage of abscess  - smoking cessation discussed and strongly encouraged   - okay for discharge after incision and drainage  - outpatient surgical follow-up in 1 week  All of the above findings and recommendations were discussed with the patient, and all of patient's questions were answered to his expressed satisfaction.  Thank you for the opportunity to participate in this patient's care.   -- Scherrie Gerlach Earlene Plater, MD, RPVI [place]: [facility] General [facility] - Partnering for exceptional care. Office: [phone]

## 2018-01-12 NOTE — Discharge Summary (Signed)
[facility] [place] [facility]   PATIENT NAME: Spencer PughRobert Schultz    MR#:  [id number]  DATE OF BIRTH:  10/03/1990  DATE OF ADMISSION:  01/10/2018 ADMITTING PHYSICIAN: Milagros LollSrikar Sudini, MD  DATE OF DISCHARGE: No discharge date for patient encounter.  PRIMARY CARE PHYSICIAN: Patient, No Pcp Per    ADMISSION DIAGNOSIS:  Lactic acidosis [E87.2] Sepsis, due to unspecified organism (HCC) [A41.9] Cellulitis of head (any part, except face) [L03.811]  DISCHARGE DIAGNOSIS:  Active Problems:   Cellulitis, neck   SECONDARY DIAGNOSIS:  History reviewed. No pertinent past medical history.  HOSPITAL COURSE:  Acute neck/scalp abscess w/ cellulitis Resolving S/p ID by surgery  Lactic acidosis Resolved with ivfs for rehydration   DISCHARGE CONDITIONS:   stable  CONSULTS OBTAINED:  Treatment Team:  Ancil Linseyavis, Jason Evan, MD  DRUG ALLERGIES:  No Known Allergies  DISCHARGE MEDICATIONS:   Allergies as of 01/12/2018   No Known Allergies     Medication List    TAKE these medications   amoxicillin-clavulanate 875-125 MG tablet Commonly known as:  AUGMENTIN Take 1 tablet by mouth 2 (two) times daily for 10 days.   cetirizine 10 MG tablet Commonly known as:  ZYRTEC Take 1 tablet (10 mg total) by mouth daily.   clindamycin 300 MG capsule Commonly known as:  CLEOCIN Take 1 capsule (300 mg total) by mouth 3 (three) times daily for 10 days.   cyclobenzaprine 10 MG tablet Commonly known as:  FLEXERIL Take 1 tablet (10 mg total) by mouth every 8 (eight) hours as needed for muscle spasms.   fluticasone 50 MCG/ACT nasal spray Commonly known as:  FLONASE Place 1 spray into both nostrils 2 (two) times daily.   HYDROcodone-acetaminophen 5-325 MG tablet Commonly known as:  NORCO/VICODIN Take 1 tablet by mouth every 4 (four) hours as needed for moderate pain.   ibuprofen 800 MG tablet Commonly known as:  ADVIL,MOTRIN Take 1 tablet (800 mg total) by mouth every  8 (eight) hours as needed.   oxyCODONE-acetaminophen 5-325 MG tablet Commonly known as:  ROXICET Take 1-2 tablets by mouth every 4 (four) hours as needed for severe pain.        DISCHARGE INSTRUCTIONS:    If you experience worsening of your admission symptoms, develop shortness of breath, life threatening emergency, suicidal or homicidal thoughts you must seek medical attention immediately by calling 911 or calling your MD immediately  if symptoms less severe.  You Must read complete instructions/literature along with all the possible adverse reactions/side effects for all the Medicines you take and that have been prescribed to you. Take any new Medicines after you have completely understood and accept all the possible adverse reactions/side effects.   Please note  You were cared for by a hospitalist during your hospital stay. If you have any questions about your discharge medications or the care you received while you were in the hospital after you are discharged, you can call the unit and asked to speak with the hospitalist on call if the hospitalist that took care of you is not available. Once you are discharged, your primary care physician will handle any further medical issues. Please note that NO REFILLS for any discharge medications will be authorized once you are discharged, as it is imperative that you return to your primary care physician (or establish a relationship with a primary care physician if you do not have one) for your aftercare needs so that they can reassess your need for medications and  monitor your lab values.    Today   CHIEF COMPLAINT:   Chief Complaint  Patient presents with  . Cellulitis    HISTORY OF PRESENT ILLNESS:  [name]  is a 27 y.o. male with neck/scalp abcess/cellulitis, failed outpt rx, admited for IV antibiotics and surgical ID.   VITAL SIGNS:  Blood pressure 120/72, pulse 66, temperature 98 F (36.7 C), temperature source Oral,  resp. rate 20, height 5\' 9"  (1.753 m), weight 95.3 kg (210 lb 3.2 oz), SpO2 99 %.  I/O:    Intake/Output Summary (Last 24 hours) at 01/12/2018 1727 Last data filed at 01/12/2018 1421 Gross per 24 hour  Intake 1690 ml  Output 750 ml  Net 940 ml    PHYSICAL EXAMINATION:  GENERAL:  27 y.o.-year-old patient lying in the bed with no acute distress.  EYES: Pupils equal, round, reactive to light and accommodation. No scleral icterus. Extraocular muscles intact.  HEENT: Head atraumatic, normocephalic. Oropharynx and nasopharynx clear.  NECK:  Supple, no jugular venous distention. No thyroid enlargement, no tenderness.  LUNGS: Normal breath sounds bilaterally, no wheezing, rales,rhonchi or crepitation. No use of accessory muscles of respiration.  CARDIOVASCULAR: S1, S2 normal. No murmurs, rubs, or gallops.  ABDOMEN: Soft, non-tender, non-distended. Bowel sounds present. No organomegaly or mass.  EXTREMITIES: No pedal edema, cyanosis, or clubbing.  NEUROLOGIC: Cranial nerves II through XII are intact. Muscle strength 5/5 in all extremities. Sensation intact. Gait not checked.  PSYCHIATRIC: The patient is alert and oriented x 3.  SKIN: No obvious rash, lesion, or ulcer.   DATA REVIEW:   CBC Recent Labs  Lab 01/11/18 0518  WBC 9.6  HGB 14.7  HCT 42.9  PLT 200    Chemistries  Recent Labs  Lab 01/10/18 1118  NA 137  K 4.6  CL 102  CO2 27  GLUCOSE 157*  BUN 12  CREATININE 1.10  CALCIUM 9.4  AST 24  ALT 17  ALKPHOS 65  BILITOT 0.7    Cardiac Enzymes No results for input(s): TROPONINI in the last 168 hours.  Microbiology Results  Results for orders placed or performed during the hospital encounter of 01/10/18  Blood Culture (routine x 2)     Status: None (Preliminary result)   Collection Time: 01/10/18  3:24 PM  Result Value Ref Range Status   Specimen Description BLOOD RT [facility]  Final   Special Requests   Final    BOTTLES DRAWN AEROBIC AND ANAEROBIC Blood Culture adequate  volume   Culture   Final    NO GROWTH 2 DAYS Performed at [facility], [address] [place], [place] [address]    Report Status PENDING  Incomplete  Blood Culture (routine x 2)     Status: None (Preliminary result)   Collection Time: 01/10/18  3:24 PM  Result Value Ref Range Status   Specimen Description BLOOD  LT [facility]  Final   Special Requests   Final    BOTTLES DRAWN AEROBIC AND ANAEROBIC Blood Culture adequate volume   Culture   Final    NO GROWTH 2 DAYS Performed at [facility], [address], [place], [place] [address]    Report Status PENDING  Incomplete    RADIOLOGY:  No results found.  EKG:  No orders found for this or any previous visit.    Management plans discussed with the patient, family and they are in agreement.  CODE STATUS:     Code Status Orders  (From admission, onward)  Start     Ordered   01/10/18 1652  Full code  Continuous     01/10/18 1652    Code Status History    This patient has a current code status but no historical code status.      TOTAL TIME TAKING CARE OF THIS PATIENT: 45 minutes.    Evelena Asa Salary M.D on 01/12/2018 at 5:27 PM  Between 7am to 6pm - Pager - [phone]  After 6pm go to [email or web address] - password [email or web address]  [facility]  Office  [phone]  CC: Primary care physician; Patient, No Pcp Per   Note: This dictation was prepared with Dragon dictation along with smaller phrase technology. Any transcriptional errors that result from this process are unintentional.

## 2018-01-15 LAB — CULTURE, BLOOD (ROUTINE X 2)
CULTURE: NO GROWTH
CULTURE: NO GROWTH
Special Requests: ADEQUATE
Special Requests: ADEQUATE

## 2018-01-17 DIAGNOSIS — L02811 Cutaneous abscess of head [any part, except face]: Secondary | ICD-10-CM

## 2018-01-17 LAB — AEROBIC/ANAEROBIC CULTURE W GRAM STAIN (SURGICAL/DEEP WOUND): Special Requests: NORMAL

## 2018-08-16 ENCOUNTER — Other Ambulatory Visit: Payer: Self-pay

## 2018-08-16 ENCOUNTER — Encounter: Payer: Self-pay | Admitting: Emergency Medicine

## 2018-08-16 ENCOUNTER — Emergency Department: Payer: Self-pay

## 2018-08-16 ENCOUNTER — Emergency Department
Admission: EM | Admit: 2018-08-16 | Discharge: 2018-08-17 | Disposition: A | Discharge status: Home / Self Care | Payer: Self-pay | Attending: Student in an Organized Health Care Education/Training Program | Admitting: Student in an Organized Health Care Education/Training Program

## 2018-08-16 DIAGNOSIS — Y998 Other external cause status: Secondary | ICD-10-CM | POA: Insufficient documentation

## 2018-08-16 DIAGNOSIS — F17210 Nicotine dependence, cigarettes, uncomplicated: Secondary | ICD-10-CM | POA: Insufficient documentation

## 2018-08-16 DIAGNOSIS — X509XXA Other and unspecified overexertion or strenuous movements or postures, initial encounter: Secondary | ICD-10-CM | POA: Insufficient documentation

## 2018-08-16 DIAGNOSIS — Y9389 Activity, other specified: Secondary | ICD-10-CM | POA: Insufficient documentation

## 2018-08-16 DIAGNOSIS — S39012A Strain of muscle, fascia and tendon of lower back, initial encounter: Secondary | ICD-10-CM | POA: Insufficient documentation

## 2018-08-16 DIAGNOSIS — F1210 Cannabis abuse, uncomplicated: Secondary | ICD-10-CM | POA: Insufficient documentation

## 2018-08-16 DIAGNOSIS — Y92018 Other place in single-family (private) house as the place of occurrence of the external cause: Secondary | ICD-10-CM | POA: Insufficient documentation

## 2018-08-16 MED ORDER — METHOCARBAMOL 500 MG PO TABS: 500.0000 mg | ORAL_TABLET | Freq: Four times a day (QID) | ORAL | 0 refills | Status: DC

## 2018-08-16 MED ORDER — ONDANSETRON 8 MG PO TBDP
8.0000 mg | ORAL_TABLET | Freq: Once | ORAL | Status: AC
  Administered 2018-08-16: 8 mg via ORAL
  Filled 2018-08-16: qty 1

## 2018-08-16 MED ORDER — OXYCODONE-ACETAMINOPHEN 5-325 MG PO TABS
1.0000 | ORAL_TABLET | Freq: Once | ORAL | Status: AC
  Administered 2018-08-16: 1 via ORAL
  Filled 2018-08-16: qty 1

## 2018-08-16 MED ORDER — MELOXICAM 15 MG PO TABS: 15.0000 mg | ORAL_TABLET | Freq: Every day | ORAL | 0 refills | Status: DC

## 2018-08-16 MED ORDER — ORPHENADRINE CITRATE 30 MG/ML IJ SOLN
60.0000 mg | Freq: Once | INTRAMUSCULAR | Status: AC
  Administered 2018-08-16: 60 mg via INTRAMUSCULAR
  Filled 2018-08-16: qty 2

## 2018-08-16 MED ORDER — KETOROLAC TROMETHAMINE 30 MG/ML IJ SOLN
30.0000 mg | Freq: Once | INTRAMUSCULAR | Status: AC
  Administered 2018-08-16: 30 mg via INTRAMUSCULAR
  Filled 2018-08-16: qty 1
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
## 2018-08-16 NOTE — ED Triage Notes (Signed)
States while walking around about 10am, felt pop lower back. About 30 min later started getting lower back pain, increasing during day. Radiates to both legs.

## 2018-08-17 NOTE — ED Notes (Signed)
Pt discharged from ED at 2130 08/16/18.

## 2018-08-17 NOTE — ED Notes (Signed)

## 2019-01-27 ENCOUNTER — Emergency Department: Payer: Self-pay

## 2019-01-27 ENCOUNTER — Encounter: Payer: Self-pay | Admitting: Emergency Medicine

## 2019-01-27 ENCOUNTER — Emergency Department
Admission: EM | Admit: 2019-01-27 | Discharge: 2019-01-28 | Disposition: A | Discharge status: Home / Self Care | Payer: Self-pay | Attending: Emergency Medicine | Admitting: Emergency Medicine

## 2019-01-27 ENCOUNTER — Other Ambulatory Visit: Payer: Self-pay

## 2019-01-27 DIAGNOSIS — Y92410 Unspecified street and highway as the place of occurrence of the external cause: Secondary | ICD-10-CM | POA: Insufficient documentation

## 2019-01-27 DIAGNOSIS — Y93I9 Activity, other involving external motion: Secondary | ICD-10-CM | POA: Insufficient documentation

## 2019-01-27 DIAGNOSIS — Y998 Other external cause status: Secondary | ICD-10-CM | POA: Insufficient documentation

## 2019-01-27 DIAGNOSIS — F17210 Nicotine dependence, cigarettes, uncomplicated: Secondary | ICD-10-CM | POA: Insufficient documentation

## 2019-01-27 DIAGNOSIS — L03116 Cellulitis of left lower limb: Secondary | ICD-10-CM | POA: Insufficient documentation

## 2019-01-27 MED ORDER — ONDANSETRON HCL 4 MG/2ML IJ SOLN
4.0000 mg | Freq: Once | INTRAMUSCULAR | Status: AC
  Administered 2019-01-27: 4 mg via INTRAVENOUS
  Filled 2019-01-27: qty 2

## 2019-01-27 MED ORDER — VANCOMYCIN HCL IN DEXTROSE 1-5 GM/200ML-% IV SOLN
1000.0000 mg | Freq: Once | INTRAVENOUS | Status: AC
  Administered 2019-01-27: 1000 mg via INTRAVENOUS
  Filled 2019-01-27: qty 200

## 2019-01-27 MED ORDER — MORPHINE SULFATE (PF) 4 MG/ML IV SOLN
4.0000 mg | Freq: Once | INTRAVENOUS | Status: AC
  Administered 2019-01-27: 4 mg via INTRAVENOUS
  Filled 2019-01-27: qty 1
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
## 2019-01-28 LAB — CBC
HCT: 44.8 % (ref 39.0–52.0)
Hemoglobin: 15.1 g/dL (ref 13.0–17.0)
MCH: 31.7 pg (ref 26.0–34.0)
MCHC: 33.7 g/dL (ref 30.0–36.0)
MCV: 93.9 fL (ref 80.0–100.0)
Platelets: 216 10*3/uL (ref 150–400)
RBC: 4.77 MIL/uL (ref 4.22–5.81)
RDW: 13.5 % (ref 11.5–15.5)
WBC: 10 10*3/uL (ref 4.0–10.5)
nRBC: 0 % (ref 0.0–0.2)

## 2019-01-28 LAB — COMPREHENSIVE METABOLIC PANEL
ALT: 164 U/L — ABNORMAL HIGH (ref 0–44)
AST: 77 U/L — ABNORMAL HIGH (ref 15–41)
Albumin: 3.7 g/dL (ref 3.5–5.0)
Alkaline Phosphatase: 65 U/L (ref 38–126)
Anion gap: 9 (ref 5–15)
BUN: 16 mg/dL (ref 6–20)
CO2: 24 mmol/L (ref 22–32)
Calcium: 8.7 mg/dL — ABNORMAL LOW (ref 8.9–10.3)
Chloride: 109 mmol/L (ref 98–111)
Creatinine, Ser: 1.21 mg/dL (ref 0.61–1.24)
GFR calc Af Amer: >60 mL/min (ref >60)
GFR calc non Af Amer: >60 mL/min (ref >60)
Glucose, Bld: 89 mg/dL (ref 70–99)
Potassium: 3.6 mmol/L (ref 3.5–5.1)
Sodium: 142 mmol/L (ref 135–145)
Total Bilirubin: 0.5 mg/dL (ref 0.3–1.2)
Total Protein: 6.7 g/dL (ref 6.5–8.1)

## 2019-01-28 MED ORDER — TRAMADOL HCL 50 MG PO TABS: 50.0000 mg | ORAL_TABLET | Freq: Four times a day (QID) | ORAL | 0 refills | Status: DC | PRN

## 2019-01-28 MED ORDER — SILVER SULFADIAZINE 1 % EX CREA: TOPICAL_CREAM | Freq: Once | CUTANEOUS | Status: DC

## 2019-01-28 MED ORDER — SULFAMETHOXAZOLE-TRIMETHOPRIM 800-160 MG PO TABS: 1.0000 | ORAL_TABLET | Freq: Two times a day (BID) | ORAL | 0 refills | Status: AC

## 2019-01-28 MED ORDER — SULFAMETHOXAZOLE-TRIMETHOPRIM 800-160 MG PO TABS
1.0000 | ORAL_TABLET | Freq: Once | ORAL | Status: AC
  Administered 2019-01-28: 1 via ORAL
  Filled 2019-01-28 (×2): qty 1

## 2019-01-31 ENCOUNTER — Encounter: Payer: Self-pay | Admitting: Emergency Medicine

## 2019-01-31 ENCOUNTER — Other Ambulatory Visit: Payer: Self-pay

## 2019-01-31 ENCOUNTER — Emergency Department
Admission: EM | Admit: 2019-01-31 | Discharge: 2019-01-31 | Disposition: A | Discharge status: Home / Self Care | Payer: Self-pay | Attending: Emergency Medicine | Admitting: Emergency Medicine

## 2019-01-31 DIAGNOSIS — F1510 Other stimulant abuse, uncomplicated: Secondary | ICD-10-CM | POA: Insufficient documentation

## 2019-01-31 DIAGNOSIS — F17210 Nicotine dependence, cigarettes, uncomplicated: Secondary | ICD-10-CM | POA: Insufficient documentation

## 2019-01-31 DIAGNOSIS — Z79899 Other long term (current) drug therapy: Secondary | ICD-10-CM | POA: Insufficient documentation

## 2019-01-31 LAB — CBC WITH DIFFERENTIAL/PLATELET
Abs Immature Granulocytes: 0.02 10*3/uL (ref 0.00–0.07)
Basophils Absolute: 0 10*3/uL (ref 0.0–0.1)
Basophils Relative: 0 %
Eosinophils Absolute: 0 10*3/uL (ref 0.0–0.5)
Eosinophils Relative: 1 %
HCT: 49.5 % (ref 39.0–52.0)
Hemoglobin: 16.9 g/dL (ref 13.0–17.0)
Immature Granulocytes: 0 %
Lymphocytes Relative: 14 %
Lymphs Abs: 1.3 10*3/uL (ref 0.7–4.0)
MCH: 31.2 pg (ref 26.0–34.0)
MCHC: 34.1 g/dL (ref 30.0–36.0)
MCV: 91.5 fL (ref 80.0–100.0)
Monocytes Absolute: 1.2 10*3/uL — ABNORMAL HIGH (ref 0.1–1.0)
Monocytes Relative: 14 %
Neutro Abs: 6.3 10*3/uL (ref 1.7–7.7)
Neutrophils Relative %: 71 %
Platelets: 243 10*3/uL (ref 150–400)
RBC: 5.41 MIL/uL (ref 4.22–5.81)
RDW: 13.5 % (ref 11.5–15.5)
WBC: 8.9 10*3/uL (ref 4.0–10.5)
nRBC: 0 % (ref 0.0–0.2)

## 2019-01-31 LAB — COMPREHENSIVE METABOLIC PANEL
ALT: 182 U/L — ABNORMAL HIGH (ref 0–44)
AST: 101 U/L — ABNORMAL HIGH (ref 15–41)
Albumin: 4.9 g/dL (ref 3.5–5.0)
Alkaline Phosphatase: 56 U/L (ref 38–126)
Anion gap: 13 (ref 5–15)
BUN: 21 mg/dL — ABNORMAL HIGH (ref 6–20)
CO2: 24 mmol/L (ref 22–32)
Calcium: 9.8 mg/dL (ref 8.9–10.3)
Chloride: 101 mmol/L (ref 98–111)
Creatinine, Ser: 1.54 mg/dL — ABNORMAL HIGH (ref 0.61–1.24)
GFR calc Af Amer: >60 mL/min (ref >60)
GFR calc non Af Amer: >60 mL/min (ref >60)
Glucose, Bld: 145 mg/dL — ABNORMAL HIGH (ref 70–99)
Potassium: 3.8 mmol/L (ref 3.5–5.1)
Sodium: 138 mmol/L (ref 135–145)
Total Bilirubin: 1.2 mg/dL (ref 0.3–1.2)
Total Protein: 8.4 g/dL — ABNORMAL HIGH (ref 6.5–8.1)

## 2019-01-31 LAB — ETHANOL: Alcohol, Ethyl (B): <10 mg/dL (ref <10)

## 2019-01-31 MED ORDER — LORAZEPAM 2 MG/ML IJ SOLN
2.0000 mg | Freq: Once | INTRAMUSCULAR | Status: AC
  Administered 2019-01-31: 03:00:00 2 mg via INTRAVENOUS
  Filled 2019-01-31: qty 1

## 2019-01-31 MED ORDER — SODIUM CHLORIDE 0.9 % IV BOLUS
1000.0000 mL | Freq: Once | INTRAVENOUS | Status: AC
  Administered 2019-01-31: 01:00:00 1000 mL via INTRAVENOUS

## 2019-01-31 MED ORDER — LORAZEPAM 2 MG/ML IJ SOLN
2.0000 mg | Freq: Once | INTRAMUSCULAR | Status: AC
  Administered 2019-01-31: 01:00:00 2 mg via INTRAVENOUS
  Filled 2019-01-31: qty 1

## 2019-01-31 NOTE — ED Notes (Signed)
Pt discharged to home.  Denies SI, HI, and A/V hallucinations.  Contracts for safety on the unit.  Verbalizes understanding of discharge instructions and follow up care.

## 2019-01-31 NOTE — ED Notes (Signed)
Pt asksing "what labs are they doin' on me". Pt updated on evaluation process and lab work ordered. Pt sipping on po fluids. Sinus tachycardia continues on cardiac monitor.

## 2019-01-31 NOTE — ED Notes (Signed)
He has ambulated to the BR with a steady gait

## 2019-01-31 NOTE — ED Notes (Signed)
Patient continues to be restless, tachy and hypertensive. Dr Marisa Severin informed and orders received.

## 2019-01-31 NOTE — ED Provider Notes (Signed)
Patient is in no acute distress, is alert and oriented.  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  He is cleared for outpatient follow-up.   Emily Filbert, MD 01/31/19 [phone]

## 2019-01-31 NOTE — ED Notes (Signed)
Lemon lime drink provided

## 2019-01-31 NOTE — ED Provider Notes (Signed)
[facility] Emergency Department Provider Note ____________________________________________   First MD Initiated Contact with Patient 01/31/19 [phone]     (approximate)  I have reviewed the triage vital signs and the nursing notes.   HISTORY  Chief Complaint Drug Overdose    HPI Spencer Schultz. is a 27 y.o. male with PMH as noted below who presents with paranoid behavior after using methamphetamine today.  The patient states he has used methamphetamine a few times, and states that it always makes him feel this way.  The patient states that he was concerned someone is trying to shoot him or his family members.  He apparently curled up into a fetal position and appeared very upset, which concerned his family.  He denies alcohol or any other drug use today.  He denies any acute medical complaints except for cellulitis to his left ankle which he is currently being treated for.  He denies SI or HI.  History reviewed. No pertinent past medical history.  Patient Active Problem List   Diagnosis Date Noted  . Scalp abscess   . Cellulitis, neck 01/10/2018    Past Surgical History:  Procedure Laterality Date  . TONSILLECTOMY      Prior to Admission medications   Medication Sig Start Date End Date Taking? Authorizing Provider  cetirizine (ZYRTEC) 10 MG tablet Take 1 tablet (10 mg total) by mouth daily. Patient not taking: Reported on 01/10/2018 04/25/17   Cuthriell, Delorise Royals, PA-C  cyclobenzaprine (FLEXERIL) 10 MG tablet Take 1 tablet (10 mg total) by mouth every 8 (eight) hours as needed for muscle spasms. Patient not taking: Reported on 01/10/2018 11/29/15   Ignacia Bayley, PA-C  fluticasone [facility]) 50 MCG/ACT nasal spray Place 1 spray into both nostrils 2 (two) times daily. Patient not taking: Reported on 01/10/2018 04/25/17   Cuthriell, Delorise Royals, PA-C  ibuprofen (ADVIL,MOTRIN) 800 MG tablet Take 1 tablet (800 mg total) by mouth every 8 (eight) hours as  needed. Patient not taking: Reported on 01/10/2018 11/29/15   Ignacia Bayley, PA-C  meloxicam (MOBIC) 15 MG tablet Take 1 tablet (15 mg total) by mouth daily. 08/16/18   Cuthriell, Delorise Royals, PA-C  methocarbamol (ROBAXIN) 500 MG tablet Take 1 tablet (500 mg total) by mouth 4 (four) times daily. 08/16/18   Cuthriell, Delorise Royals, PA-C  oxyCODONE-acetaminophen (ROXICET) 5-325 MG tablet Take 1-2 tablets by mouth every 4 (four) hours as needed for severe pain. Patient not taking: Reported on 01/10/2018 03/01/17   Loleta Rose, MD  sulfamethoxazole-trimethoprim (BACTRIM DS) 800-160 MG tablet Take 1 tablet by mouth 2 (two) times daily for 10 days. 01/28/19 02/07/19  Darci Current, MD  traMADol (ULTRAM) 50 MG tablet Take 1 tablet (50 mg total) by mouth every 6 (six) hours as needed. 01/28/19 01/28/20  Darci Current, MD    Allergies Patient has no known allergies.  Family History  Problem Relation Age of Onset  . Skin cancer Neg Hx     Social History Social History   Tobacco Use  . Smoking status: Current Every Day Smoker    Packs/day: 1.00    Types: Cigarettes  . Smokeless tobacco: Never Used  Substance Use Topics  . Alcohol use: No  . Drug use: Yes    Types: Marijuana    Review of Systems  Constitutional: No fever. Eyes: No redness. ENT: No sore throat. Cardiovascular: Denies chest pain. Respiratory: Denies shortness of breath. Gastrointestinal: No vomiting or diarrhea.  Genitourinary: Negative for dysuria.  Musculoskeletal: Negative for back pain. Skin: Positive for rash. Neurological: Negative for headache.   ____________________________________________   PHYSICAL EXAM:  VITAL SIGNS: ED Triage Vitals  Enc Vitals Group     BP 01/31/19 0106 (!) 193/110     Pulse Rate 01/31/19 0106 (!) 128     Resp 01/31/19 0106 18     Temp --      Temp Source 01/31/19 0106 Oral     SpO2 01/31/19 0106 99 %     Weight 01/31/19 0107 199 lb 15.3 oz (90.7 kg)     Height 01/31/19 0107 5'  9" (1.753 m)     Head Circumference --      Peak Flow --      Pain Score 01/31/19 0107 0     Pain Loc --      Pain Edu? --      Excl. in GC? --     Constitutional: Alert and oriented.  Intermittently tearful and anxious appearing. Eyes: Conjunctivae are normal.  Pupils dilated but reactive. Head: Atraumatic. Nose: No congestion/rhinnorhea. Mouth/Throat: Mucous membranes are slightly dry.   Neck: Normal range of motion.  Cardiovascular: Tachycardic, regular rhythm. Good peripheral circulation. Respiratory: Normal respiratory effort.  No retractions. Gastrointestinal: No distention.  Musculoskeletal: No lower extremity edema.  Extremities warm and well perfused.  Left dorsal foot and distal lower leg with erythema, induration, and slight warmth.  No open wounds.  No fluctuance or abscess.  Full range of motion at the left ankle. Neurologic:  Normal speech and language. No gross focal neurologic deficits are appreciated.  Skin:  Skin is warm and dry. No rash noted. Psychiatric: Anxious appearing and intermittently tearful.  No SI/HI.  Calm and cooperative.  ____________________________________________   LABS (all labs ordered are listed, but only abnormal results are displayed)  Labs Reviewed  COMPREHENSIVE METABOLIC PANEL - Abnormal; Notable for the following components:      Result Value   Glucose, Bld 145 (*)    BUN 21 (*)    Creatinine, Ser 1.54 (*)    Total Protein 8.4 (*)    AST 101 (*)    ALT 182 (*)    All other components within normal limits  CBC WITH DIFFERENTIAL/PLATELET - Abnormal; Notable for the following components:   Monocytes Absolute 1.2 (*)    All other components within normal limits  ETHANOL  URINE DRUG SCREEN, QUALITATIVE ([facility] ONLY)   ____________________________________________  EKG  ED ECG REPORT I, Dionne BucySebastian Taneia Mealor, the attending physician, personally viewed and interpreted this ECG.  Date: 01/31/2019 EKG Time: 0102 Rate: 122 Rhythm:  Sinus tachycardia QRS Axis: normal Intervals: normal ST/T Wave abnormalities: normal Narrative Interpretation: no evidence of acute ischemia  ____________________________________________  RADIOLOGY    ____________________________________________   PROCEDURES  Procedure(s) performed: No  Procedures  Critical Care performed: No ____________________________________________   INITIAL IMPRESSION / ASSESSMENT AND PLAN / ED COURSE  Pertinent labs & imaging results that were available during my care of the patient were reviewed by me and considered in my medical decision making (see chart for details).  28 year old male with PMH as noted above presents with anxiety and paranoid thoughts after using methamphetamine.  The patient is currently being treated for cellulitis on his left lower extremity but denies any other acute medical issues.  He denies SI or HI.  On exam, the patient is tachycardic and hypertensive.  His other vital signs are normal.  The remainder of the exam is as described above.  Although  the patient does express some paranoid thoughts, stating he was concerned that someone was hiding under a couch and was going to shoot him or his family, he is calm and cooperative, behaving appropriately during my evaluation, answering questions appropriately, and is not responding to any internal stimuli.  Overall presentation is consistent with symptoms related to methamphetamine, with some paranoid thoughts but no evidence of acute psychosis.  The patient is able to contract for safety, and is not a danger to self or others at this time.  We will give fluids, treat symptomatically with Ativan, obtain lab work-up, and reassess.  The cellulitis appears to be relatively limited in area and I do not suspect sepsis or other severe infection.  If he has a very elevated WBC count develops a fever, or has other concerning findings on the work-up we can reassess.   ----------------------------------------- 6:45 AM on 01/31/2019 -----------------------------------------  Lab work-up is unremarkable and the patient remains stable.  He had minimal improvement with the initial dose of Ativan, so I gave a second dose.  The patient has now been sleeping comfortably for several hours and his vital signs have normalized.  Anticipate discharge home when the patient is sober and alert.  I am signing him out to the oncoming physician.   ____________________________________________   FINAL CLINICAL IMPRESSION(S) / ED DIAGNOSES  Final diagnoses:  Methamphetamine abuse (HCC)      NEW MEDICATIONS STARTED DURING THIS VISIT:  New Prescriptions   No medications on file     Note:  This document was prepared using Dragon voice recognition software and may include unintentional dictation errors.    Dionne Bucy, MD 01/31/19 [phone]

## 2019-01-31 NOTE — ED Notes (Signed)
Pt sleeping at this time.
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
## 2019-02-02 LAB — CULTURE, BLOOD (ROUTINE X 2)
Culture: NO GROWTH
Culture: NO GROWTH
Special Requests: ADEQUATE
Special Requests: ADEQUATE

## 2019-04-03 ENCOUNTER — Other Ambulatory Visit: Payer: Self-pay

## 2019-04-03 ENCOUNTER — Emergency Department: Payer: Self-pay

## 2019-04-03 ENCOUNTER — Emergency Department
Admission: EM | Admit: 2019-04-03 | Discharge: 2019-04-03 | Disposition: A | Discharge status: Home / Self Care | Payer: Self-pay | Attending: Emergency Medicine | Admitting: Emergency Medicine

## 2019-04-03 DIAGNOSIS — M25511 Pain in right shoulder: Secondary | ICD-10-CM | POA: Insufficient documentation

## 2019-04-03 DIAGNOSIS — Z79899 Other long term (current) drug therapy: Secondary | ICD-10-CM | POA: Insufficient documentation

## 2019-04-03 DIAGNOSIS — F17210 Nicotine dependence, cigarettes, uncomplicated: Secondary | ICD-10-CM | POA: Insufficient documentation

## 2019-04-03 MED ORDER — OXYCODONE-ACETAMINOPHEN 5-325 MG PO TABS: 1.0000 | ORAL_TABLET | Freq: Four times a day (QID) | ORAL | 0 refills | Status: AC | PRN

## 2019-04-03 MED ORDER — OXYCODONE-ACETAMINOPHEN 5-325 MG PO TABS
2.0000 | ORAL_TABLET | Freq: Once | ORAL | Status: AC
  Administered 2019-04-03: 2 via ORAL
  Filled 2019-04-03: qty 2

## 2019-04-03 NOTE — ED Notes (Signed)
Patient' mother is here for transport.
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
## 2019-04-03 NOTE — ED Notes (Signed)
Patient is waiting in room and calling his mother for transport. This Probation officer and patient walked out to the parking lot, but the patient's wife had left.
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
## 2019-04-03 NOTE — ED Notes (Signed)
Patient's mother is on the way per patient's report.

## 2019-04-03 NOTE — ED Notes (Signed)
Patient states he is in pain and is restless on the stretcher. Patient states he took Tylenol 4-5 hours ago without relief.

## 2019-04-03 NOTE — Discharge Instructions (Signed)
As we discussed, your x-rays do not show any sign of broken bones or dislocation in your right shoulder.  You may have torn something such as your rotator cuff which will require you to follow-up with orthopedic specialist for further evaluation and treatment.  Please use the sling in the meantime for comfort (a shoulder immobilizer may actually make the pain worse by making the shoulder even more stiff over time) and use over-the-counter ibuprofen and Tylenol as well as cold packs.  I provided prescription for stronger pain medicine to use over the next couple of days but remember that this is a one-time prescription and that you need to follow-up with orthopedics and should avoid taking the stronger medicine as much as possible.

## 2019-05-08 ENCOUNTER — Emergency Department
Admission: EM | Admit: 2019-05-08 | Discharge: 2019-05-09 | Disposition: A | Discharge status: Home / Self Care | Payer: Self-pay | Attending: Emergency Medicine | Admitting: Emergency Medicine

## 2019-05-08 ENCOUNTER — Other Ambulatory Visit: Payer: Self-pay

## 2019-05-08 DIAGNOSIS — F17210 Nicotine dependence, cigarettes, uncomplicated: Secondary | ICD-10-CM | POA: Insufficient documentation

## 2019-05-08 DIAGNOSIS — R45850 Homicidal ideations: Secondary | ICD-10-CM | POA: Insufficient documentation

## 2019-05-08 DIAGNOSIS — F1410 Cocaine abuse, uncomplicated: Secondary | ICD-10-CM | POA: Insufficient documentation

## 2019-05-08 DIAGNOSIS — F1210 Cannabis abuse, uncomplicated: Secondary | ICD-10-CM | POA: Insufficient documentation

## 2019-05-08 DIAGNOSIS — F1510 Other stimulant abuse, uncomplicated: Secondary | ICD-10-CM | POA: Insufficient documentation

## 2019-05-08 DIAGNOSIS — F1910 Other psychoactive substance abuse, uncomplicated: Secondary | ICD-10-CM

## 2019-05-08 DIAGNOSIS — F1310 Sedative, hypnotic or anxiolytic abuse, uncomplicated: Secondary | ICD-10-CM | POA: Insufficient documentation

## 2019-05-08 DIAGNOSIS — Z20828 Contact with and (suspected) exposure to other viral communicable diseases: Secondary | ICD-10-CM | POA: Insufficient documentation

## 2019-05-08 HISTORY — DX: Irritability and anger: R45.4

## 2019-05-08 LAB — URINE DRUG SCREEN, QUALITATIVE (ARMC ONLY)
Amphetamines, Ur Screen: NOT DETECTED
Barbiturates, Ur Screen: NOT DETECTED
Benzodiazepine, Ur Scrn: NOT DETECTED
Cannabinoid 50 Ng, Ur ~~LOC~~: NOT DETECTED
Cocaine Metabolite,Ur ~~LOC~~: NOT DETECTED
MDMA (Ecstasy)Ur Screen: NOT DETECTED
Methadone Scn, Ur: NOT DETECTED
Opiate, Ur Screen: NOT DETECTED
Phencyclidine (PCP) Ur S: NOT DETECTED
Tricyclic, Ur Screen: NOT DETECTED

## 2019-05-08 LAB — CBC WITH DIFFERENTIAL/PLATELET
Abs Immature Granulocytes: 0.02 10*3/uL (ref 0.00–0.07)
Basophils Absolute: 0 10*3/uL (ref 0.0–0.1)
Basophils Relative: 0 %
Eosinophils Absolute: 0 10*3/uL (ref 0.0–0.5)
Eosinophils Relative: 0 %
HCT: 50.9 % (ref 39.0–52.0)
Hemoglobin: 17.1 g/dL — ABNORMAL HIGH (ref 13.0–17.0)
Immature Granulocytes: 0 %
Lymphocytes Relative: 23 %
Lymphs Abs: 2.5 10*3/uL (ref 0.7–4.0)
MCH: 31.4 pg (ref 26.0–34.0)
MCHC: 33.6 g/dL (ref 30.0–36.0)
MCV: 93.4 fL (ref 80.0–100.0)
Monocytes Absolute: 1.1 10*3/uL — ABNORMAL HIGH (ref 0.1–1.0)
Monocytes Relative: 10 %
Neutro Abs: 7.1 10*3/uL (ref 1.7–7.7)
Neutrophils Relative %: 67 %
Platelets: 242 10*3/uL (ref 150–400)
RBC: 5.45 MIL/uL (ref 4.22–5.81)
RDW: 13.8 % (ref 11.5–15.5)
WBC: 10.7 10*3/uL — ABNORMAL HIGH (ref 4.0–10.5)
nRBC: 0 % (ref 0.0–0.2)

## 2019-05-08 LAB — ETHANOL: Alcohol, Ethyl (B): <10 mg/dL (ref <10)

## 2019-05-08 LAB — COMPREHENSIVE METABOLIC PANEL
ALT: 192 U/L — ABNORMAL HIGH (ref 0–44)
AST: 83 U/L — ABNORMAL HIGH (ref 15–41)
Albumin: 4.9 g/dL (ref 3.5–5.0)
Alkaline Phosphatase: 57 U/L (ref 38–126)
Anion gap: 12 (ref 5–15)
BUN: 23 mg/dL — ABNORMAL HIGH (ref 6–20)
CO2: 24 mmol/L (ref 22–32)
Calcium: 9.8 mg/dL (ref 8.9–10.3)
Chloride: 106 mmol/L (ref 98–111)
Creatinine, Ser: 1.54 mg/dL — ABNORMAL HIGH (ref 0.61–1.24)
GFR calc Af Amer: >60 mL/min (ref >60)
GFR calc non Af Amer: >60 mL/min (ref >60)
Glucose, Bld: 152 mg/dL — ABNORMAL HIGH (ref 70–99)
Potassium: 3.4 mmol/L — ABNORMAL LOW (ref 3.5–5.1)
Sodium: 142 mmol/L (ref 135–145)
Total Bilirubin: 1 mg/dL (ref 0.3–1.2)
Total Protein: 8.3 g/dL — ABNORMAL HIGH (ref 6.5–8.1)

## 2019-05-08 NOTE — ED Provider Notes (Signed)
[facility] Emergency Department Provider Note  ____________________________________________   First MD Initiated Contact with Patient 05/08/19 [phone]     (approximate)  I have reviewed the triage vital signs and the nursing notes.   HISTORY  Chief Complaint Medical Clearance (IVC)    HPI Spencer SicilianRobert J Fagerstrom Jr. is a 28 y.o. male who presents via [employer] for behavioral med evaluation patient was IVC by his father for threatening to hurt others and being on drugs.  Patient denies any auditory visual hallucinations.  Pt endorses using meth today.  Denies drugs otherwise.  He does endorse saying thinks about hurting himself but blames that on the drugs and denies an actual plan.  Denies any chest pain, fever, dysuria.   SI denies currently but endorses when using drug, moderate, nothing makes it better.          Past Medical History:  Diagnosis Date  . Anger     Patient Active Problem List   Diagnosis Date Noted  . Scalp abscess   . Cellulitis, neck 01/10/2018    Past Surgical History:  Procedure Laterality Date  . TONSILLECTOMY      Prior to Admission medications   Medication Sig Start Date End Date Taking? Authorizing Provider  cetirizine (ZYRTEC) 10 MG tablet Take 1 tablet (10 mg total) by mouth daily. Patient not taking: Reported on 01/10/2018 04/25/17   Cuthriell, Delorise RoyalsJonathan D, PA-C  cyclobenzaprine (FLEXERIL) 10 MG tablet Take 1 tablet (10 mg total) by mouth every 8 (eight) hours as needed for muscle spasms. Patient not taking: Reported on 01/10/2018 11/29/15   [name], Jayshun, PA-C  fluticasone [facility](FLONASE) 50 MCG/ACT nasal spray Place 1 spray into both nostrils 2 (two) times daily. Patient not taking: Reported on 01/10/2018 04/25/17   Cuthriell, Delorise RoyalsJonathan D, PA-C  ibuprofen (ADVIL,MOTRIN) 800 MG tablet Take 1 tablet (800 mg total) by mouth every 8 (eight) hours as needed. Patient not taking: Reported on 01/10/2018 11/29/15   [name], Dilraj, PA-C  meloxicam  (MOBIC) 15 MG tablet Take 1 tablet (15 mg total) by mouth daily. 08/16/18   Cuthriell, Delorise RoyalsJonathan D, PA-C  methocarbamol (ROBAXIN) 500 MG tablet Take 1 tablet (500 mg total) by mouth 4 (four) times daily. 08/16/18   Cuthriell, Delorise RoyalsJonathan D, PA-C  traMADol (ULTRAM) 50 MG tablet Take 1 tablet (50 mg total) by mouth every 6 (six) hours as needed. 01/28/19 01/28/20  [name], Keene N, MD    Allergies Patient has no known allergies.  Family History  Problem Relation Age of Onset  . Skin cancer Neg Hx     Social History Social History   Tobacco Use  . Smoking status: Current Every Day Smoker    Packs/day: 1.00    Types: Cigarettes  . Smokeless tobacco: Never Used  Substance Use Topics  . Alcohol use: No  . Drug use: Yes    Types: Marijuana      Review of Systems Constitutional: No fever/chills Eyes: No visual changes. ENT: No sore throat. Cardiovascular: Denies chest pain. Respiratory: Denies shortness of breath. Gastrointestinal: No abdominal pain.  No nausea, no vomiting.  No diarrhea.  No constipation. Genitourinary: Negative for dysuria. Musculoskeletal: Negative for back pain. Skin: Negative for rash. Neurological: Negative for headaches, focal weakness or numbness. All other ROS negative ____________________________________________   PHYSICAL EXAM:  VITAL SIGNS: ED Triage Vitals [05/08/19 2010]  Enc Vitals Group     BP (!) 160/96     Pulse Rate 97     Resp 18  Temp 98.7 F (37.1 C)     Temp Source Oral     SpO2 98 %     Weight 210 lb (95.3 kg)     Height 5\' 9"  (1.753 m)     Head Circumference      Peak Flow      Pain Score 0     Pain Loc      Pain Edu?      Excl. in [place]?     Constitutional: Alert and oriented. Well appearing and in no acute distress. Eyes: Conjunctivae are normal. EOMI. Head: Atraumatic. Nose: No congestion/rhinnorhea. Mouth/Throat: Mucous membranes are moist.   Neck: No stridor. Trachea Midline. FROM Cardiovascular: Normal rate,  regular rhythm. Grossly normal heart sounds.  Good peripheral circulation. Respiratory: Normal respiratory effort.  No retractions. Lungs CTAB. Gastrointestinal: Soft and nontender. No distention. No abdominal bruits.  Musculoskeletal: No lower extremity tenderness nor edema.  No joint effusions. Neurologic:  Normal speech and language. No gross focal neurologic deficits are appreciated.  Skin:  Skin is warm, dry and intact. No rash noted. Psychiatric: appears under the influence but denying si hi ah vh GU: Deferred   ____________________________________________   LABS (all labs ordered are listed, but only abnormal results are displayed)  Labs Reviewed  COMPREHENSIVE METABOLIC PANEL - Abnormal; Notable for the following components:      Result Value   Potassium 3.4 (*)    Glucose, Bld 152 (*)    BUN 23 (*)    Creatinine, Ser 1.54 (*)    Total Protein 8.3 (*)    AST 83 (*)    ALT 192 (*)    All other components within normal limits  CBC WITH DIFFERENTIAL/PLATELET - Abnormal; Notable for the following components:   WBC 10.7 (*)    Hemoglobin 17.1 (*)    Monocytes Absolute 1.1 (*)    All other components within normal limits  ACETAMINOPHEN LEVEL - Abnormal; Notable for the following components:   Acetaminophen (Tylenol), Serum <10 (*)    All other components within normal limits  URINE DRUG SCREEN, QUALITATIVE ([facility] ONLY) - Abnormal; Notable for the following components:   Amphetamines, Ur Screen DETECTED (*)    Cocaine Metabolite,Ur [place] DETECTED (*)    Cannabinoid 50 Ng, Ur [place] DETECTED (*)    Benzodiazepine, Ur Scrn DETECTED (*)    All other components within normal limits  SARS CORONAVIRUS 2 (HOSPITAL ORDER, [place] [facility])  ETHANOL  URINE DRUG SCREEN, QUALITATIVE ([facility] ONLY)  SALICYLATE LEVEL   ____________________________________________  PROCEDURES  Procedure(s) performed (including Critical Care):  Procedures    ____________________________________________   INITIAL IMPRESSION / ASSESSMENT AND PLAN / ED COURSE  Glennie [place]. was evaluated in Emergency Department on 05/08/2019 for the symptoms described in the history of present illness. He was evaluated in the context of the global COVID-19 pandemic, which necessitated consideration that the patient might be at risk for infection with the SARS-CoV-2 virus that causes COVID-19. Institutional protocols and algorithms that pertain to the evaluation of patients at risk for COVID-19 are in a state of rapid change based on information released by regulatory bodies including the CDC and federal and state organizations. These policies and algorithms were followed during the patient's care in the ED.     Pt is without any acute medical complaints. No exam findings to suggest medical cause of current presentation. Will order psychiatric screening labs and discuss further w/ psychiatric service.  D/d includes but  is not limited to psychiatric disease, behavioral/personality disorder, inadequate socioeconomic support, medical.  Based on HPI, exam, unremarkable labs, no concern for acute medical problem at this time. No rigidity, clonus, hyperthermia, focal neurologic deficit, diaphoresis, tachycardia, meningismus, ataxia, gait abnormality or other finding to suggest this visit represents a non-psychiatric problem. Screening labs reviewed.    Given this, pt medically cleared, to be dispositioned per Psych.  Labs reviewed and re-assuring cr at baseline.  Multiple + drug use.         ____________________________________________   FINAL CLINICAL IMPRESSION(S) / ED DIAGNOSES   Final diagnoses:  Evaluation by psychiatric service required  Polysubstance abuse (HCC)      MEDICATIONS GIVEN DURING THIS VISIT:  Medications - No data to display   ED Discharge Orders    None       Note:  This document was prepared using Dragon voice recognition  software and may include unintentional dictation errors.   Concha SeFunke, Stacey Maura E, MD 05/09/19 [phone]

## 2019-05-08 NOTE — ED Notes (Addendum)
Pt changed into behavorial clothing by this tech and [employer], following items obtained are as follows....  1 blue t-shirt, 1 pair of green shoes, 1 pair of socks, 1 pair of jeans, 1 green belt, 1 pair of black underwear  Pt belongings placed in belongings bag and labeled and placed at [employer]  Pt has two pink nipple rings in that are silver in color and per pt "can't be removed, I'm glued to bar to the ball" RN Dominica Severin made aware

## 2019-05-08 NOTE — ED Notes (Signed)
Pt. Transferred from Triage to 20 hall after dressing out and screening for contraband. Pt. Oriented to Quad including Q15 minute rounds as well as Rover and Officer for their protection. Patient is alert and oriented, warm and dry in no acute distress. Patient denies SI, HI, and AVH. Pt. Encouraged to let me know if needs arise.  

## 2019-05-08 NOTE — ED Notes (Signed)
Hourly rounding reveals patient in hall bed. No complaints, stable, in no acute distress. Q15 minute rounds and monitoring via Rover and Officer to continue.  

## 2019-05-08 NOTE — ED Notes (Signed)
Hourly rounding reveals patient in hall bed. No complaints, stable, in no acute distress. Q15 minute rounds and monitoring via Engineer, drilling to continue. Snack and beverage given.

## 2019-05-08 NOTE — ED Triage Notes (Signed)
Patient to ED via [employer] for behavioral med evaluation. Patient denies auditory or visual hallucinations at this time but admits to that in the past. Father called [employer] and had patient IVC'd for threatening to hurt others and is on "many drugs." Patient cooperative at this time.

## 2019-05-09 LAB — SALICYLATE LEVEL: Salicylate Lvl: <7 mg/dL (ref 2.8–30.0)

## 2019-05-09 LAB — URINE DRUG SCREEN, QUALITATIVE (ARMC ONLY)
Amphetamines, Ur Screen: DETECTED — AB
Barbiturates, Ur Screen: NOT DETECTED
Benzodiazepine, Ur Scrn: DETECTED — AB
Cannabinoid 50 Ng, Ur ~~LOC~~: DETECTED — AB
Cocaine Metabolite,Ur ~~LOC~~: DETECTED — AB
MDMA (Ecstasy)Ur Screen: NOT DETECTED
Methadone Scn, Ur: NOT DETECTED
Opiate, Ur Screen: NOT DETECTED
Phencyclidine (PCP) Ur S: NOT DETECTED
Tricyclic, Ur Screen: NOT DETECTED

## 2019-05-09 LAB — ACETAMINOPHEN LEVEL: Acetaminophen (Tylenol), Serum: <10 ug/mL — ABNORMAL LOW (ref 10–30)

## 2019-05-09 LAB — SARS CORONAVIRUS 2 BY RT PCR (HOSPITAL ORDER, PERFORMED IN ~~LOC~~ HOSPITAL LAB): SARS Coronavirus 2: NEGATIVE

## 2019-05-09 NOTE — Discharge Instructions (Signed)
You have been seen in the Emergency Department (ED) today for substance abuse.  You have been evaluated by the ED physician(s) and/or by the behavioral medicine specialists and are being discharged with outpatient resources and follow up recommendations.  Please return to the ED immediately if you have ANY thoughts of hurting yourself or anyone else, so that we may help you.  Please avoid alcohol and drug use.  Follow up with your doctor and/or therapist as soon as possible regarding today's ED  visit.   Please follow up any other recommendations and clinic appointments provided by the psychiatry team that saw you in the Emergency Department. 

## 2019-05-09 NOTE — ED Notes (Signed)
Hourly rounding reveals patient in hall bed. No complaints, stable, in no acute distress. Q15 minute rounds and monitoring via Rover and Officer to continue.  

## 2019-05-09 NOTE — ED Notes (Signed)
Patient to consult room for [facility].

## 2019-05-09 NOTE — ED Provider Notes (Signed)
-----------------------------------------   1:56 AM on 05/09/2019 -----------------------------------------   Blood pressure (!) 160/96, pulse 97, temperature 98.7 F (37.1 C), temperature source Oral, resp. rate 18, height 1.753 m (5\' 9" ), weight 95.3 kg, SpO2 98 %.  The patient is calm and cooperative at this time.  I read the written evaluation from the tele-psychiatrist who reversed the involuntary commitment and does not feel he needs IVC nor inpatient treatment criteria.  Recommended outpatient resources for substance abuse.  I provided my usual customary discharge instructions and recommendations and return precautions.   Hinda Kehr, MD 05/09/19 [phone]

## 2019-05-11 ENCOUNTER — Ambulatory Visit: Payer: Self-pay

## 2019-10-12 ENCOUNTER — Ambulatory Visit: Payer: Self-pay | Attending: Internal Medicine

## 2019-10-12 DIAGNOSIS — U071 COVID-19: Secondary | ICD-10-CM | POA: Insufficient documentation

## 2019-10-12 DIAGNOSIS — Z20822 Contact with and (suspected) exposure to covid-19: Secondary | ICD-10-CM

## 2019-10-13 LAB — NOVEL CORONAVIRUS, NAA: SARS-CoV-2, NAA: DETECTED — AB

## 2019-10-14 ENCOUNTER — Telehealth: Payer: Self-pay

## 2019-10-14 NOTE — Telephone Encounter (Signed)
Pt has questions about what medications he can take for Covid positive.   Will have nurse call him back  [facility]

## 2019-10-14 NOTE — Telephone Encounter (Signed)
Call to patient- patient has questions about how to treat congestion, no taste/smell. Advised hydration, OTC medications as needed. Virtual visit for symptoms that he feels OTC is not helping. Patient understands instructions.

## 2019-10-27 ENCOUNTER — Ambulatory Visit: Payer: Self-pay | Attending: Internal Medicine

## 2019-10-27 DIAGNOSIS — Z20822 Contact with and (suspected) exposure to covid-19: Secondary | ICD-10-CM | POA: Insufficient documentation

## 2019-10-28 LAB — NOVEL CORONAVIRUS, NAA: SARS-CoV-2, NAA: NOT DETECTED

## 2019-12-17 ENCOUNTER — Encounter: Payer: Self-pay | Admitting: Emergency Medicine

## 2019-12-17 ENCOUNTER — Other Ambulatory Visit: Payer: Self-pay

## 2019-12-17 ENCOUNTER — Emergency Department
Admission: EM | Admit: 2019-12-17 | Discharge: 2019-12-17 | Disposition: A | Discharge status: Home / Self Care | Payer: Self-pay | Attending: Emergency Medicine | Admitting: Emergency Medicine

## 2019-12-17 DIAGNOSIS — S61412A Laceration without foreign body of left hand, initial encounter: Secondary | ICD-10-CM | POA: Insufficient documentation

## 2019-12-17 DIAGNOSIS — W2209XA Striking against other stationary object, initial encounter: Secondary | ICD-10-CM | POA: Insufficient documentation

## 2019-12-17 DIAGNOSIS — S51812A Laceration without foreign body of left forearm, initial encounter: Secondary | ICD-10-CM | POA: Insufficient documentation

## 2019-12-17 DIAGNOSIS — S0181XA Laceration without foreign body of other part of head, initial encounter: Secondary | ICD-10-CM | POA: Insufficient documentation

## 2019-12-17 DIAGNOSIS — Z23 Encounter for immunization: Secondary | ICD-10-CM | POA: Insufficient documentation

## 2019-12-17 DIAGNOSIS — Y939 Activity, unspecified: Secondary | ICD-10-CM | POA: Insufficient documentation

## 2019-12-17 DIAGNOSIS — F17210 Nicotine dependence, cigarettes, uncomplicated: Secondary | ICD-10-CM | POA: Insufficient documentation

## 2019-12-17 DIAGNOSIS — Y999 Unspecified external cause status: Secondary | ICD-10-CM | POA: Insufficient documentation

## 2019-12-17 DIAGNOSIS — Y929 Unspecified place or not applicable: Secondary | ICD-10-CM | POA: Insufficient documentation

## 2019-12-17 MED ORDER — TETANUS-DIPHTH-ACELL PERTUSSIS 5-2.5-18.5 LF-MCG/0.5 IM SUSP
0.5000 mL | Freq: Once | INTRAMUSCULAR | Status: AC
  Administered 2019-12-17: 08:00:00 0.5 mL via INTRAMUSCULAR
  Filled 2019-12-17: qty 0.5

## 2019-12-17 NOTE — Discharge Instructions (Signed)
Follow-up with your primary care provider or [facility] if any continued problems.  Watch the areas for any signs of infection.  Keep the areas clean and dry.  Let the Dermabond glue fall off on its own which should take approximately 5 to 7 days.  The Steri-Strips that were placed on your left hand you will also need to keep clean and dry.  Let the Steri-Strips fall off on their own which also should take approximately 5 to 7 days.  Do not pick at the Steri-Strips to loosen them.  You may take Tylenol if needed for pain.  A tetanus shot was given to you today to update your immunizations.  Also your initial blood pressure was elevated at 151/109.  Have your blood pressure rechecked in the next week to assess whether you need blood pressure medication.
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
## 2020-04-03 ENCOUNTER — Ambulatory Visit: Payer: Self-pay

## 2020-06-29 DEATH — deceased
